# Patient Record
Sex: Female | Born: 1960 | Race: White | Hispanic: No | Marital: Married | Smoking: Never smoker
Health system: Southern US, Community
[De-identification: ages and names within clinical notes are randomized; demographics above are authoritative.]

## PROBLEM LIST (undated history)

## (undated) DIAGNOSIS — R569 Unspecified convulsions: Secondary | ICD-10-CM

## (undated) DIAGNOSIS — E785 Hyperlipidemia, unspecified: Secondary | ICD-10-CM

## (undated) DIAGNOSIS — I10 Essential (primary) hypertension: Secondary | ICD-10-CM

## (undated) HISTORY — DX: Hyperlipidemia, unspecified: E78.5

---

## 2010-09-23 ENCOUNTER — Emergency Department: Payer: Self-pay | Admitting: *Deleted

## 2011-06-16 ENCOUNTER — Emergency Department (HOSPITAL_COMMUNITY): Payer: 59

## 2011-06-16 ENCOUNTER — Emergency Department (HOSPITAL_COMMUNITY)
Admission: EM | Admit: 2011-06-16 | Discharge: 2011-06-16 | Disposition: A | Discharge status: Home / Self Care | Payer: 59 | Attending: Emergency Medicine | Admitting: Emergency Medicine

## 2011-06-16 ENCOUNTER — Encounter (HOSPITAL_COMMUNITY): Payer: Self-pay | Admitting: *Deleted

## 2011-06-16 DIAGNOSIS — S60229A Contusion of unspecified hand, initial encounter: Secondary | ICD-10-CM | POA: Insufficient documentation

## 2011-06-16 DIAGNOSIS — I10 Essential (primary) hypertension: Secondary | ICD-10-CM | POA: Insufficient documentation

## 2011-06-16 DIAGNOSIS — M79609 Pain in unspecified limb: Secondary | ICD-10-CM | POA: Insufficient documentation

## 2011-06-16 HISTORY — DX: Essential (primary) hypertension: I10

## 2011-06-16 HISTORY — DX: Unspecified convulsions: R56.9
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
## 2011-06-16 NOTE — Discharge Instructions (Signed)
Contusion A contusion is a deep bruise. Contusions are the result of an injury that caused bleeding under the skin. The contusion may turn blue, purple, or yellow. Minor injuries will give you a painless contusion, but more severe contusions may stay painful and swollen for a few weeks.  CAUSES  A contusion is usually caused by a blow, trauma, or direct force to an area of the body. SYMPTOMS   Swelling and redness of the injured area.   Bruising of the injured area.   Tenderness and soreness of the injured area.   Pain.  DIAGNOSIS  The diagnosis can be made by taking a history and physical exam. An X-ray, CT scan, or MRI may be needed to determine if there were any associated injuries, such as fractures. TREATMENT  Specific treatment will depend on what area of the body was injured. In general, the best treatment for a contusion is resting, icing, elevating, and applying cold compresses to the injured area. Over-the-counter medicines may also be recommended for pain control. Ask your caregiver what the best treatment is for your contusion. HOME CARE INSTRUCTIONS   Put ice on the injured area.   Put ice in a plastic bag.   Place a towel between your skin and the bag.   Leave the ice on for 15 to 20 minutes, 3 to 4 times a day.   Only take over-the-counter or prescription medicines for pain, discomfort, or fever as directed by your caregiver. Your caregiver may recommend avoiding anti-inflammatory medicines (aspirin, ibuprofen, and naproxen) for 48 hours because these medicines may increase bruising.   Rest the injured area.   If possible, elevate the injured area to reduce swelling.  SEEK IMMEDIATE MEDICAL CARE IF:   You have increased bruising or swelling.   You have pain that is getting worse.   Your swelling or pain is not relieved with medicines.  MAKE SURE YOU:   Understand these instructions.   Will watch your condition.   Will get help right away if you are not  doing well or get worse.  Document Released: 10/12/2004 Document Revised: 12/22/2010 Document Reviewed: 11/07/2010 [facility] Patient Information 2012 [place], [place].    As discussed,  Use your splint for comfort and swelling reduction.  You may stop using this once your pain is better.  Use ibuprofen 600 mg every 8 hours (with food).  Please call your doctor for a recheck if not improving over the next week.  Your xrays are negative today.  Continue using an ice pack 10 minutes every hour while awake for the next 2 days.
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
## 2011-06-16 NOTE — ED Notes (Signed)
Pt refused sling at this time, states she does not feel she would use it.
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
## 2011-06-17 NOTE — ED Provider Notes (Signed)
Medical screening examination/treatment/procedure(s) were performed by non-physician practitioner and as supervising physician I was immediately available for consultation/collaboration.   Dayton Bailiff, MD 06/17/11 [phone]

## 2012-06-25 ENCOUNTER — Telehealth: Payer: Self-pay | Admitting: Neurology

## 2012-07-27 ENCOUNTER — Other Ambulatory Visit: Payer: Self-pay | Admitting: Neurology

## 2012-10-01 ENCOUNTER — Telehealth: Payer: Self-pay | Admitting: Neurology

## 2012-10-01 ENCOUNTER — Encounter: Payer: Self-pay | Admitting: Neurology

## 2012-10-01 NOTE — Telephone Encounter (Signed)
Patient left message that she needed a letter faxed to [facility] stating her upcoming appointment on 11-14-12.  I have faxed letter to [facility] and received a confirmation.  Also left message for patient that letter was faxed.

## 2012-10-24 ENCOUNTER — Telehealth: Payer: Self-pay | Admitting: Family Medicine

## 2012-10-25 NOTE — Telephone Encounter (Signed)
Not seen here in over 6 months, Dr. Anne Hahn has been filling, notified pt

## 2012-11-13 ENCOUNTER — Telehealth: Payer: Self-pay | Admitting: *Deleted

## 2012-11-14 ENCOUNTER — Ambulatory Visit (INDEPENDENT_AMBULATORY_CARE_PROVIDER_SITE_OTHER): Payer: 59 | Admitting: Nurse Practitioner

## 2012-11-14 ENCOUNTER — Encounter (INDEPENDENT_AMBULATORY_CARE_PROVIDER_SITE_OTHER): Payer: Self-pay

## 2012-11-14 ENCOUNTER — Encounter: Payer: Self-pay | Admitting: Nurse Practitioner

## 2012-11-14 VITALS — BP 116/72 | HR 83 | Ht 69.0 in | Wt 301.0 lb

## 2012-11-14 DIAGNOSIS — G40309 Generalized idiopathic epilepsy and epileptic syndromes, not intractable, without status epilepticus: Secondary | ICD-10-CM | POA: Insufficient documentation

## 2012-11-14 DIAGNOSIS — Z79899 Other long term (current) drug therapy: Secondary | ICD-10-CM | POA: Insufficient documentation

## 2012-11-14 DIAGNOSIS — Z0289 Encounter for other administrative examinations: Secondary | ICD-10-CM

## 2012-11-14 LAB — PHENYTOIN LEVEL, TOTAL: Phenytoin Lvl: 22.2 ug/mL (ref 10.0–20.0)

## 2012-11-14 MED ORDER — DILANTIN 100 MG PO CAPS: ORAL_CAPSULE | ORAL | Status: DC

## 2012-11-14 NOTE — Patient Instructions (Signed)
Check Dilantin level Will renew Dilantin, given co-pay card information Followup yearly and when necessary

## 2012-11-14 NOTE — Progress Notes (Signed)
I have read the note, and I agree with the clinical assessment and plan.  Jahzir Strohmeier KEITH   

## 2012-11-14 NOTE — Progress Notes (Signed)
[facility]  PATIENT: Maria Rivers DOB: Jan 07, 1961   REASON FOR VISIT: Followup for seizure disorder   HISTORY OF PRESENT ILLNESS:Maria Rivers, 52 year old returns for follow up. He has history of obesity, and a history of seizures. She was last seen 05/03/11.  Last seizure activity was  a brief staring episode in July 2012, and the Dilantin level was found to be low in the 6.0 range. The patient was increased on the Dilantin, currently taking 700 mg of Dilantin daily BRAND. The patient has done well on this dose, and has not had any signs of toxicity such as drowsiness or gait instability. Dr. Christell Constant follows routine labs.   Pt has DMV forms to fill out.    REVIEW OF SYSTEMS: Full 14 system review of systems performed and notable only for:  Constitutional: N/A  Cardiovascular: N/A  Ear/Nose/Throat: N/A  Skin: N/A  Eyes: N/A  Respiratory: N/A  Gastroitestinal: N/A  Hematology/Lymphatic: N/A  Endocrine: N/A Musculoskeletal: Osteoarthritis Allergy/Immunology: Seasonal allergies  Neurological: N/A Psychiatric: N/A   ALLERGIES: Allergies  Allergen Reactions  . Mushroom Extract Complex Swelling  . Penicillins     UNKNOWN REACTION  . Shellfish Allergy Swelling    HOME MEDICATIONS: Outpatient Prescriptions Prior to Visit  Medication Sig Dispense Refill  . cholecalciferol (VITAMIN D) 1000 UNITS tablet Take 2,000 Units by mouth 2 (two) times daily.       Marland Kitchen DILANTIN 100 MG ER capsule TAKE 3 CAPSULES BY MOUTH IN THE MORNING, AND 4 CAPSULES IN THE EVENING.  210 capsule  0  . hydrochlorothiazide (HYDRODIURIL) 25 MG tablet Take 25 mg by mouth daily.      Marland Kitchen ibuprofen (ADVIL,MOTRIN) 200 MG tablet Take 200 mg by mouth as needed. For pain      . lisinopril (PRINIVIL,ZESTRIL) 10 MG tablet Take 10 mg by mouth daily.      Marland Kitchen loratadine (CLARITIN) 10 MG tablet Take 10 mg by mouth daily.       No facility-administered medications prior to visit.    PAST MEDICAL HISTORY: Past  Medical History  Diagnosis Date  . Hypertension   . Seizures     PAST SURGICAL HISTORY: History reviewed. No pertinent past surgical history.  FAMILY HISTORY: History reviewed. No pertinent family history.  SOCIAL HISTORY: History   Social History  . Marital Status: Married    Spouse Name: N/A    Number of Children: N/A  . Years of Education: N/A   Occupational History  . Not on file.   Social History Main Topics  . Smoking status: Never Smoker   . Smokeless tobacco: Never Used  . Alcohol Use: No  . Drug Use: No  . Sexual Activity: Yes    Birth Control/ Protection: None   Other Topics Concern  . Not on file   Social History Narrative  . No narrative on file     PHYSICAL EXAM  Filed Vitals:   11/14/12 1357  BP: 116/72  Pulse: 83  Height: 5\' 9"  (1.753 m)  Weight: 301 lb (136.533 kg)   Body mass index is 44.43 kg/(m^2).  Generalized: Well developed, morbidly obese female in no acute distress   Neurological examination   Mentation: Alert oriented to time, place, history taking. Follows all commands speech and language fluent  Cranial nerve II-XII: Pupils were equal round reactive to light extraocular movements were full, visual field were full on confrontational test. Facial sensation and strength were normal. hearing was intact to finger rubbing bilaterally.  Uvula tongue midline. head turning and shoulder shrug and were normal and symmetric.Tongue protrusion into cheek strength was normal. Motor: normal bulk and tone, full strength in the BUE, BLE, fine finger movements normal, no pronator drift. No focal weakness Coordination: finger-nose-finger,  no dysmetria Reflexes: Depressed and symmetric upper and lower Gait and Station: Rising up from seated position without assistance, normal stance,  moderate stride, good arm swing, smooth turning, able to perform tiptoe, and heel walking without difficulty. Tandem gait steady   DIAGNOSTIC DATA (LABS, IMAGING,  TESTING) None to review   ASSESSMENT AND PLAN  52 y.o. year old female  has a past medical history of Seizures. here in followup. Currently on brand-name Dilantin 700 mg daily in divided doses  Check Dilantin level, PCP Dr. Christell Constant follows routine labs Will renew Dilantin, given co-pay card information Followup yearly and when necessary  [name], [facility], [facility], APRN  [facility] [address], [address] [place], [place] [address] [phone]

## 2012-11-21 ENCOUNTER — Other Ambulatory Visit: Payer: Self-pay | Admitting: Family Medicine

## 2012-11-28 ENCOUNTER — Ambulatory Visit (INDEPENDENT_AMBULATORY_CARE_PROVIDER_SITE_OTHER): Payer: 59 | Admitting: Family Medicine

## 2012-11-28 ENCOUNTER — Encounter: Payer: Self-pay | Admitting: Family Medicine

## 2012-11-28 VITALS — BP 119/82 | HR 86 | Temp 97.3°F | Ht 69.0 in | Wt 296.0 lb

## 2012-11-28 DIAGNOSIS — G40309 Generalized idiopathic epilepsy and epileptic syndromes, not intractable, without status epilepticus: Secondary | ICD-10-CM

## 2012-11-28 DIAGNOSIS — R569 Unspecified convulsions: Secondary | ICD-10-CM

## 2012-11-28 DIAGNOSIS — E785 Hyperlipidemia, unspecified: Secondary | ICD-10-CM

## 2012-11-28 DIAGNOSIS — E782 Mixed hyperlipidemia: Secondary | ICD-10-CM | POA: Insufficient documentation

## 2012-11-28 DIAGNOSIS — I10 Essential (primary) hypertension: Secondary | ICD-10-CM

## 2012-11-28 DIAGNOSIS — E039 Hypothyroidism, unspecified: Secondary | ICD-10-CM

## 2012-11-28 LAB — POCT CBC
HCT, POC: 40.6 % (ref 37.7–47.9)
Hemoglobin: 13.4 g/dL (ref 12.2–16.2)
MCH, POC: 29.9 pg (ref 27–31.2)
MCHC: 32.9 g/dL (ref 31.8–35.4)
MCV: 90.8 fL (ref 80–97)
POC Granulocyte: 5.9 (ref 2–6.9)
WBC: 8.2 10*3/uL (ref 4.6–10.2)

## 2012-11-28 NOTE — Progress Notes (Signed)
Subjective:    Patient ID: Maria Rivers, female    DOB: 1960/06/08, 52 y.o.   MRN: [id number]  HPI Pt here for follow up and management of chronic medical problems. Problems basically include hyperlipidemia, hypertension, high risk medication use, and hypothyroidism. On reviewing the health maintenance parameters she refuses to get a mammogram, Pap smear/pelvic, DEXA scan, chest x-ray, FOBT, and colonoscopy. She has had her flu shot. She will check her insurance regarding the Prevnar. We will: And draw labs work today even though she is not fasting. One year ago her weight was 295. Today it is 296.      Patient Active Problem List   Diagnosis Date Noted  . Generalized convulsive epilepsy without mention of intractable epilepsy 11/14/2012  . Encounter for long-term (current) use of other medications 11/14/2012   Outpatient Encounter Prescriptions as of 11/28/2012  Medication Sig  . cholecalciferol (VITAMIN D) 1000 UNITS tablet Take 2,000 Units by mouth 2 (two) times daily.   Marland Kitchen DILANTIN 100 MG ER capsule 3 caps in the am and 4 in the pm  . hydrochlorothiazide (HYDRODIURIL) 25 MG tablet TAKE 1 TABLET BY MOUTH ONCE DAILY  . ibuprofen (ADVIL,MOTRIN) 200 MG tablet Take 200 mg by mouth as needed. For pain  . levothyroxine (SYNTHROID, LEVOTHROID) 25 MCG tablet TAKE 1 TABLET BY MOUTH EVERY DAY  . lisinopril (PRINIVIL,ZESTRIL) 10 MG tablet TAKE 1 TABLET BY MOUTH ONCE DAILY  . loratadine (CLARITIN) 10 MG tablet Take 10 mg by mouth daily.  . pravastatin (PRAVACHOL) 40 MG tablet TAKE 1 TABLET BY MOUTH ONCE DAILY    Review of Systems  Constitutional: Negative.   HENT: Negative.   Eyes: Negative.   Respiratory: Negative.   Cardiovascular: Negative.   Gastrointestinal: Negative.   Endocrine: Negative.   Genitourinary: Negative.   Musculoskeletal: Negative.   Skin: Negative.   Allergic/Immunologic: Negative.   Neurological: Negative.   Hematological: Negative.   Psychiatric/Behavioral:  Negative.        Objective:   Physical Exam  Nursing note and vitals reviewed. Constitutional: She is oriented to person, place, and time. She appears well-developed and well-nourished.  Overweight  HENT:  Head: Normocephalic and atraumatic.  Right Ear: External ear normal.  Left Ear: External ear normal.  Mouth/Throat: Oropharynx is clear and moist. No oropharyngeal exudate.  Nasal congestion bilaterally  Eyes: Conjunctivae and EOM are normal. Pupils are equal, round, and reactive to light. Right eye exhibits no discharge. Left eye exhibits no discharge. No scleral icterus.  Neck: Normal range of motion. Neck supple. No thyromegaly present.  Cardiovascular: Normal rate, regular rhythm, normal heart sounds and intact distal pulses.  Exam reveals no gallop and no friction rub.   No murmur heard. At 72 per minute  Pulmonary/Chest: Effort normal and breath sounds normal. No respiratory distress. She has no wheezes. She has no rales.  Abdominal: Soft. Bowel sounds are normal. She exhibits no mass. There is no tenderness. There is no rebound and no guarding.  Obesity  Musculoskeletal: Normal range of motion. She exhibits no edema and no tenderness.  Lymphadenopathy:    She has no cervical adenopathy.  Neurological: She is alert and oriented to person, place, and time. She has normal reflexes.  Skin: Skin is warm and dry. No rash noted.  Psychiatric: She has a normal mood and affect. Her behavior is normal. Judgment and thought content normal.   BP 119/82  Pulse 86  Temp(Src) 97.3 F (36.3 C) (Oral)  Ht 5\' 9"  (1.753 m)  Wt 296 lb (134.265 kg)  BMI 43.69 kg/m2  LMP 03/16/2011        Assessment & Plan:   1. HTN (hypertension)   2. Seizures   3. Hyperlipidemia   4. Hypothyroidism   5. Generalized convulsive epilepsy without mention of intractable epilepsy   6. Morbid obesity    Orders Placed This Encounter  Procedures  . Hepatic function panel  . Lipid panel  .  BMP8+EGFR  . Vit D  25 hydroxy (rtn osteoporosis monitoring)  . Thyroid Panel With TSH  . POCT CBC   No orders of the defined types were placed in this encounter.   Patient Instructions  Continue current medications. Continue aggressive therapeutic lifestyle changes which include good diet and exercise. Fall precautions discussed with patient. If you are over 75 years old - you may need Prevnar 13 or the adult Pneumonia vaccine. Also check and see if the hospital will give you a zostavax   Nyra Capes MD

## 2012-11-28 NOTE — Patient Instructions (Addendum)
Continue current medications. Continue aggressive therapeutic lifestyle changes which include good diet and exercise. Fall precautions discussed with patient. If you are over 54 years old - you may need Prevnar 13 or the adult Pneumonia vaccine. Also check and see if the hospital will give you a zostavax

## 2012-11-29 LAB — THYROID PANEL WITH TSH
T4, Total: 6.2 ug/dL (ref 4.5–12.0)
TSH: 2.67 u[IU]/mL (ref 0.450–4.500)

## 2012-11-29 LAB — LIPID PANEL
Cholesterol, Total: 220 mg/dL — ABNORMAL HIGH (ref 100–199)
LDL Calculated: 146 mg/dL — ABNORMAL HIGH (ref 0–99)

## 2012-11-29 LAB — BMP8+EGFR
BUN/Creatinine Ratio: 24 — ABNORMAL HIGH (ref 9–23)
BUN: 24 mg/dL (ref 6–24)
CO2: 27 mmol/L (ref 18–29)
Calcium: 9.3 mg/dL (ref 8.7–10.2)
Creatinine, Ser: 0.98 mg/dL (ref 0.57–1.00)
GFR calc non Af Amer: 66 mL/min/{1.73_m2} (ref >59)

## 2012-11-29 LAB — HEPATIC FUNCTION PANEL
AST: 21 IU/L (ref 0–40)
Alkaline Phosphatase: 112 IU/L (ref 39–117)
Bilirubin, Direct: 0.12 mg/dL (ref 0.00–0.40)
Total Protein: 7.4 g/dL (ref 6.0–8.5)

## 2012-12-02 ENCOUNTER — Other Ambulatory Visit: Payer: Self-pay | Admitting: *Deleted

## 2012-12-02 DIAGNOSIS — E785 Hyperlipidemia, unspecified: Secondary | ICD-10-CM

## 2012-12-02 MED ORDER — PRAVASTATIN SODIUM 40 MG PO TABS: 80.0000 mg | ORAL_TABLET | Freq: Every day | ORAL | Status: DC

## 2012-12-02 NOTE — Telephone Encounter (Signed)
Patient was seen. 

## 2012-12-05 ENCOUNTER — Telehealth: Payer: Self-pay | Admitting: Neurology

## 2012-12-05 NOTE — Telephone Encounter (Signed)
I called patient. Forms have been faxed to [facility]. Were sent yesterday. Will mail forms along with confirmation to patient.

## 2012-12-06 ENCOUNTER — Telehealth: Payer: Self-pay | Admitting: Neurology

## 2013-03-12 IMAGING — CR DG FOREARM 2V*R*
1 series · 1 of 1 positions shown · non-contrast
Comparison: None.

CLINICAL DATA: Forearm and hand pain.  Bruise on the medial
proximal forearm area.

RIGHT FOREARM - 2 VIEW

[view not recorded]
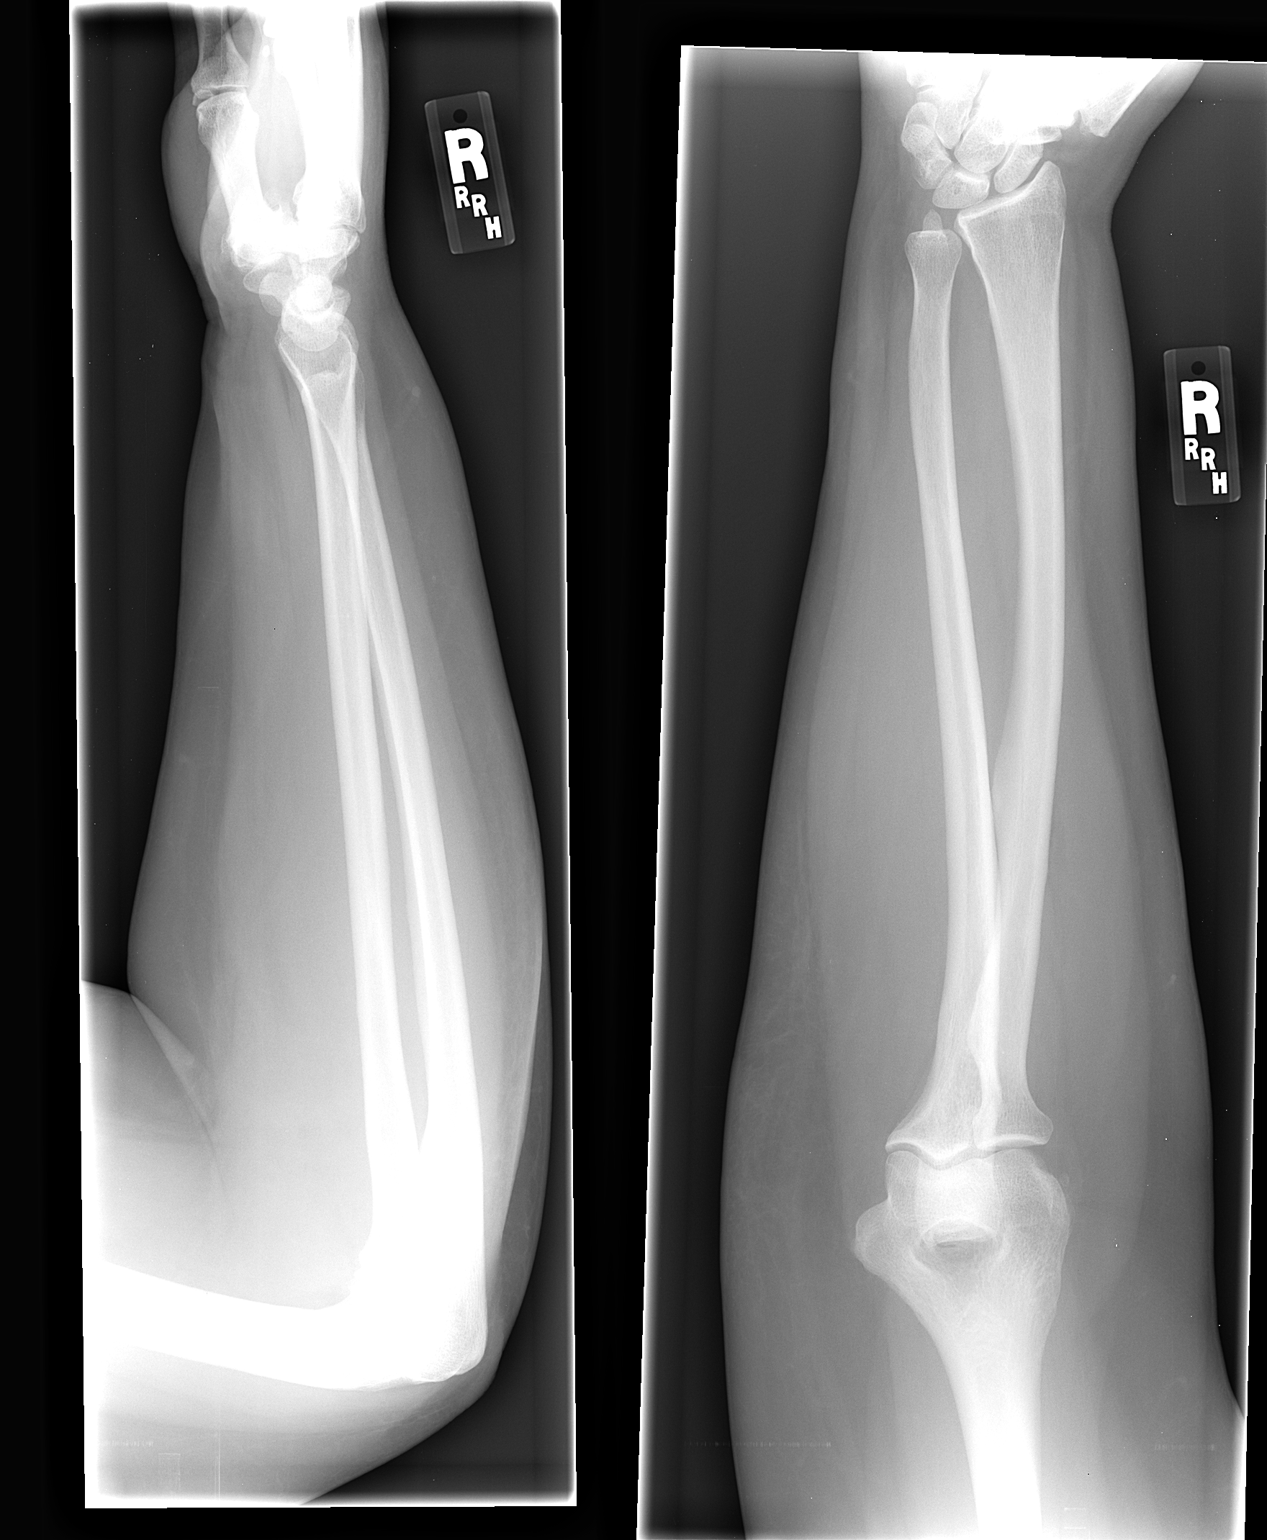

[1 of 1 positions shown; findings below may reference images not displayed]

FINDINGS: Radius and ulna appear normal.  Partially visualized
basal joint of the thumb osteoarthritis.  Partial visualization of
the elbow joint is within normal limits.  Calcific tendonitis of
the common extensor origin.  No fracture or radiopaque foreign
body.
IMPRESSION: No acute osseous abnormality.

## 2013-06-05 ENCOUNTER — Encounter: Payer: Self-pay | Admitting: Family Medicine

## 2013-06-05 ENCOUNTER — Ambulatory Visit (INDEPENDENT_AMBULATORY_CARE_PROVIDER_SITE_OTHER): Payer: 59 | Admitting: Family Medicine

## 2013-06-05 VITALS — BP 115/75 | HR 82 | Temp 97.1°F | Ht 69.0 in | Wt 292.0 lb

## 2013-06-05 DIAGNOSIS — E8881 Metabolic syndrome: Secondary | ICD-10-CM | POA: Insufficient documentation

## 2013-06-05 DIAGNOSIS — E785 Hyperlipidemia, unspecified: Secondary | ICD-10-CM

## 2013-06-05 DIAGNOSIS — E039 Hypothyroidism, unspecified: Secondary | ICD-10-CM

## 2013-06-05 DIAGNOSIS — G40909 Epilepsy, unspecified, not intractable, without status epilepticus: Secondary | ICD-10-CM

## 2013-06-05 DIAGNOSIS — I10 Essential (primary) hypertension: Secondary | ICD-10-CM

## 2013-06-05 DIAGNOSIS — E559 Vitamin D deficiency, unspecified: Secondary | ICD-10-CM

## 2013-06-05 LAB — POCT CBC
Granulocyte percent: 71.7 %G (ref 37–80)
HCT, POC: 41.2 % (ref 37.7–47.9)
HEMOGLOBIN: 13.6 g/dL (ref 12.2–16.2)
LYMPH, POC: 2.1 (ref 0.6–3.4)
MCH: 29.2 pg (ref 27–31.2)
MCHC: 32.9 g/dL (ref 31.8–35.4)
MCV: 88.8 fL (ref 80–97)
MPV: 8 fL (ref 0–99.8)
PLATELET COUNT, POC: 255 10*3/uL (ref 142–424)
POC GRANULOCYTE: 6 (ref 2–6.9)
POC LYMPH %: 25.2 % (ref 10–50)
RBC: 4.7 M/uL (ref 4.04–5.48)
RDW, POC: 13.5 %
WBC: 8.4 10*3/uL (ref 4.6–10.2)

## 2013-06-05 LAB — POCT GLYCOSYLATED HEMOGLOBIN (HGB A1C)

## 2013-06-05 NOTE — Progress Notes (Signed)
Subjective:    Patient ID: Maria Rivers, female    DOB: Jan 29, 1960, 53 y.o.   MRN: [id number]  HPI Pt here for follow up and management of chronic medical problems. The patient is followed regularly by the neurologist for her epilepsy. She'll check with her insurance regarding the Prevnar vaccine. She refuses pelvic exam, mammogram, DEXA scan, chest x-ray, and FOBT. She will get lab work done today. The Dilantin level is done by the neurologist. He is okay with this being done once yearly.          Patient Active Problem List   Diagnosis Date Noted  . Hyperlipidemia 11/28/2012  . Hypothyroidism 11/28/2012  . Hypertension 11/28/2012  . Generalized convulsive epilepsy without mention of intractable epilepsy 11/14/2012  . Encounter for long-term (current) use of other medications 11/14/2012   Outpatient Encounter Prescriptions as of 06/05/2013  Medication Sig  . cholecalciferol (VITAMIN D) 1000 UNITS tablet Take 2,000 Units by mouth daily.   Marland Kitchen DILANTIN 100 MG ER capsule 3 caps in the am and 4 in the pm  . hydrochlorothiazide (HYDRODIURIL) 25 MG tablet TAKE 1 TABLET BY MOUTH ONCE DAILY  . ibuprofen (ADVIL,MOTRIN) 200 MG tablet Take 200 mg by mouth as needed. For pain  . levothyroxine (SYNTHROID, LEVOTHROID) 25 MCG tablet TAKE 1 TABLET BY MOUTH EVERY DAY  . lisinopril (PRINIVIL,ZESTRIL) 10 MG tablet TAKE 1 TABLET BY MOUTH ONCE DAILY  . loratadine (CLARITIN) 10 MG tablet Take 10 mg by mouth daily.  . pravastatin (PRAVACHOL) 40 MG tablet Take 40 mg by mouth daily.  . [DISCONTINUED] pravastatin (PRAVACHOL) 40 MG tablet Take 2 tablets (80 mg total) by mouth daily.    Review of Systems  Constitutional: Negative.   HENT: Negative.   Eyes: Negative.   Respiratory: Negative.   Cardiovascular: Negative.   Gastrointestinal: Negative.   Endocrine: Negative.   Genitourinary: Negative.   Musculoskeletal: Negative.   Skin: Negative.   Allergic/Immunologic: Negative.   Neurological:  Negative.   Hematological: Negative.   Psychiatric/Behavioral: Negative.        Objective:   Physical Exam  Nursing note and vitals reviewed. Constitutional: She is oriented to person, place, and time. She appears well-developed and well-nourished. No distress.  Patient comes to the visit today with her husband.  HENT:  Head: Normocephalic and atraumatic.  Right Ear: External ear normal.  Left Ear: External ear normal.  Nose: Nose normal.  Mouth/Throat: Oropharynx is clear and moist.  Hygiene and gingivitis much improved  Eyes: Conjunctivae and EOM are normal. Pupils are equal, round, and reactive to light. Right eye exhibits no discharge. Left eye exhibits no discharge. No scleral icterus.  Neck: Normal range of motion. Neck supple. No thyromegaly present.  Cardiovascular: Normal rate, regular rhythm, normal heart sounds and intact distal pulses.   No murmur heard.  At 72 per minute  Pulmonary/Chest: Effort normal and breath sounds normal. No respiratory distress. She has no wheezes. She has no rales. She exhibits no tenderness.  Abdominal: Soft. Bowel sounds are normal. She exhibits no mass. There is no tenderness. There is no rebound and no guarding.  Obesity without tenderness or masses  Musculoskeletal: Normal range of motion. She exhibits no edema and no tenderness.  Lymphadenopathy:    She has no cervical adenopathy.  Neurological: She is alert and oriented to person, place, and time. She has normal reflexes. No cranial nerve deficit.  Skin: Skin is warm and dry. No rash noted.  Psychiatric: She has a normal  mood and affect. Her behavior is normal. Judgment and thought content normal.   BP 115/75  Pulse 82  Temp(Src) 97.1 F (36.2 C) (Oral)  Ht 5' 9" (1.753 m)  Wt 292 lb (132.45 kg)  BMI 43.10 kg/m2  LMP 03/16/2011        Assessment & Plan:  1. Hyperlipidemia - POCT CBC - Lipid panel  2. Hypertension - POCT CBC - BMP8+EGFR - Hepatic function panel  3.  Hypothyroidism - POCT CBC - Thyroid Panel With TSH  4. Vitamin D deficiency - Vit D  25 hydroxy (rtn osteoporosis monitoring)  5. Metabolic syndrome - POCT glycosylated hemoglobin (Hb A1C)  6. Seizure disorder -Followed by the neurologist  Patient Instructions  Continue current medications. Continue good therapeutic lifestyle changes which include good diet and exercise. Fall precautions discussed with patient. If an FOBT was given today- please return it to our front desk. If you are over 10 years old - you may need Prevnar 13 or the adult Pneumonia vaccine.  Drink more water Exercise as much as possible Continue aggressive therapeutic lifestyle changes Check with her insurance regarding the Prevnar vaccine We will call you with your lab work results once those results are available   Arrie Senate MD

## 2013-06-05 NOTE — Patient Instructions (Addendum)
Continue current medications. Continue good therapeutic lifestyle changes which include good diet and exercise. Fall precautions discussed with patient. If an FOBT was given today- please return it to our front desk. If you are over 55 years old - you may need Prevnar 13 or the adult Pneumonia vaccine.  Drink more water Exercise as much as possible Continue aggressive therapeutic lifestyle changes Check with her insurance regarding the Prevnar vaccine We will call you with your lab work results once those results are available

## 2013-06-06 LAB — BMP8+EGFR
BUN/Creatinine Ratio: 29 — ABNORMAL HIGH (ref 9–23)
BUN: 27 mg/dL — ABNORMAL HIGH (ref 6–24)
CALCIUM: 9.2 mg/dL (ref 8.7–10.2)
CHLORIDE: 92 mmol/L — AB (ref 97–108)
CO2: 25 mmol/L (ref 18–29)
Creatinine, Ser: 0.93 mg/dL (ref 0.57–1.00)
GFR calc Af Amer: 81 mL/min/{1.73_m2} (ref >59)
GFR calc non Af Amer: 70 mL/min/{1.73_m2} (ref >59)
GLUCOSE: 68 mg/dL (ref 65–99)
POTASSIUM: 4.6 mmol/L (ref 3.5–5.2)
SODIUM: 135 mmol/L (ref 134–144)

## 2013-06-06 LAB — LIPID PANEL
CHOLESTEROL TOTAL: 210 mg/dL — AB (ref 100–199)
Chol/HDL Ratio: 4.5 ratio units — ABNORMAL HIGH (ref 0.0–4.4)
HDL: 47 mg/dL (ref >39)
LDL CALC: 126 mg/dL — AB (ref 0–99)
TRIGLYCERIDES: 184 mg/dL — AB (ref 0–149)
VLDL CHOLESTEROL CAL: 37 mg/dL (ref 5–40)

## 2013-06-06 LAB — HEPATIC FUNCTION PANEL
ALT: 20 IU/L (ref 0–32)
AST: 19 IU/L (ref 0–40)
Albumin: 4.2 g/dL (ref 3.5–5.5)
Alkaline Phosphatase: 118 IU/L — ABNORMAL HIGH (ref 39–117)
BILIRUBIN DIRECT: 0.09 mg/dL (ref 0.00–0.40)
TOTAL PROTEIN: 7.2 g/dL (ref 6.0–8.5)
Total Bilirubin: <0.2 mg/dL (ref 0.0–1.2)

## 2013-06-06 LAB — THYROID PANEL WITH TSH
FREE THYROXINE INDEX: 1.2 (ref 1.2–4.9)
T3 Uptake Ratio: 24 % (ref 24–39)
T4, Total: 5 ug/dL (ref 4.5–12.0)
TSH: 3.23 u[IU]/mL (ref 0.450–4.500)

## 2013-06-06 LAB — VITAMIN D 25 HYDROXY (VIT D DEFICIENCY, FRACTURES): Vit D, 25-Hydroxy: 34.8 ng/mL (ref 30.0–100.0)

## 2013-09-26 ENCOUNTER — Encounter: Payer: Self-pay | Admitting: *Deleted

## 2013-10-06 ENCOUNTER — Ambulatory Visit (INDEPENDENT_AMBULATORY_CARE_PROVIDER_SITE_OTHER): Payer: 59 | Admitting: Family Medicine

## 2013-10-06 ENCOUNTER — Encounter: Payer: Self-pay | Admitting: Family Medicine

## 2013-10-06 ENCOUNTER — Ambulatory Visit: Payer: 59 | Admitting: Family Medicine

## 2013-10-06 VITALS — BP 119/77 | HR 82 | Temp 96.9°F | Ht 69.0 in

## 2013-10-06 DIAGNOSIS — I10 Essential (primary) hypertension: Secondary | ICD-10-CM

## 2013-10-06 DIAGNOSIS — E785 Hyperlipidemia, unspecified: Secondary | ICD-10-CM

## 2013-10-06 DIAGNOSIS — E559 Vitamin D deficiency, unspecified: Secondary | ICD-10-CM

## 2013-10-06 DIAGNOSIS — E8881 Metabolic syndrome: Secondary | ICD-10-CM

## 2013-10-06 DIAGNOSIS — E039 Hypothyroidism, unspecified: Secondary | ICD-10-CM

## 2013-10-06 DIAGNOSIS — G40309 Generalized idiopathic epilepsy and epileptic syndromes, not intractable, without status epilepticus: Secondary | ICD-10-CM

## 2013-10-06 LAB — POCT GLYCOSYLATED HEMOGLOBIN (HGB A1C): HEMOGLOBIN A1C: 5.2

## 2013-10-06 LAB — POCT CBC
GRANULOCYTE PERCENT: 70.3 % (ref 37–80)
HCT, POC: 39.8 % (ref 37.7–47.9)
HEMOGLOBIN: 13 g/dL (ref 12.2–16.2)
LYMPH, POC: 1.8 (ref 0.6–3.4)
MCH, POC: 29.5 pg (ref 27–31.2)
MCHC: 32.6 g/dL (ref 31.8–35.4)
MCV: 90.3 fL (ref 80–97)
MPV: 7.8 fL (ref 0–99.8)
PLATELET COUNT, POC: 239 10*3/uL (ref 142–424)
POC Granulocyte: 5.1 (ref 2–6.9)
POC LYMPH %: 25.3 % (ref 10–50)
RBC: 4.4 M/uL (ref 4.04–5.48)
RDW, POC: 14 %
WBC: 7.2 10*3/uL (ref 4.6–10.2)

## 2013-10-06 NOTE — Patient Instructions (Addendum)
Continue current medications. Continue good therapeutic lifestyle changes which include good diet and exercise. Fall precautions discussed with patient. If an FOBT was given today- please return it to our front desk. If you are over 55 years old - you may need Prevnar 13 or the adult Pneumonia vaccine.  Flu Shots will be available at our office starting mid- September. Please call and schedule a FLU CLINIC APPOINTMENT.   Please consider getting some other health maintenance issues taking care of We will call you of the lab work results once those results are available

## 2013-10-06 NOTE — Progress Notes (Signed)
Subjective:    Patient ID: Maria Rivers, female    DOB: 1960-12-03, 53 y.o.   MRN: [id number]  HPI Pt here for follow up and management of chronic medical problems. The patient comes to the visit today with her husband. She refuses all preventative treatments. These include colonoscopy Pap and pelvic DEXA scan chest x-ray and FOBT. She will get her traditional lab work today. She refused to get a weight today. She has no complaints. The patient denies chest pain shortness of breath were any seizure activity. She appears to be happy and in good spirits and enjoys her job.        Patient Active Problem List   Diagnosis Date Noted  . Metabolic syndrome 38/46/6599  . Hyperlipidemia 11/28/2012  . Hypothyroidism 11/28/2012  . Hypertension 11/28/2012  . Generalized convulsive epilepsy without mention of intractable epilepsy 11/14/2012  . Encounter for long-term (current) use of other medications 11/14/2012   Outpatient Encounter Prescriptions as of 10/06/2013  Medication Sig  . cholecalciferol (VITAMIN D) 1000 UNITS tablet Take 2,000 Units by mouth daily.   Marland Kitchen DILANTIN 100 MG ER capsule 3 caps in the am and 4 in the pm  . hydrochlorothiazide (HYDRODIURIL) 25 MG tablet TAKE 1 TABLET BY MOUTH ONCE DAILY  . ibuprofen (ADVIL,MOTRIN) 200 MG tablet Take 200 mg by mouth as needed. For pain  . levothyroxine (SYNTHROID, LEVOTHROID) 25 MCG tablet TAKE 1 TABLET BY MOUTH EVERY DAY  . lisinopril (PRINIVIL,ZESTRIL) 10 MG tablet TAKE 1 TABLET BY MOUTH ONCE DAILY  . loratadine (CLARITIN) 10 MG tablet Take 10 mg by mouth daily.  . pravastatin (PRAVACHOL) 40 MG tablet Take 40 mg by mouth daily.    Review of Systems  Constitutional: Negative.   HENT: Negative.   Eyes: Negative.   Respiratory: Negative.   Cardiovascular: Negative.   Gastrointestinal: Negative.   Endocrine: Negative.   Genitourinary: Negative.   Musculoskeletal: Negative.   Skin: Negative.   Allergic/Immunologic: Negative.     Neurological: Negative.   Hematological: Negative.   Psychiatric/Behavioral: Negative.        Objective:   Physical Exam  Nursing note and vitals reviewed. Constitutional: She is oriented to person, place, and time. She appears well-developed and well-nourished.  HENT:  Head: Normocephalic and atraumatic.  Right Ear: External ear normal.  Left Ear: External ear normal.  Nose: Nose normal.  Mouth/Throat: Oropharynx is clear and moist.  The patient's teeth and mouth are in good repair   Eyes: Conjunctivae and EOM are normal. Pupils are equal, round, and reactive to light. Right eye exhibits no discharge. Left eye exhibits no discharge. No scleral icterus.  Neck: Normal range of motion. Neck supple. No thyromegaly present.  No carotid bruits  Cardiovascular: Normal rate, regular rhythm, normal heart sounds and intact distal pulses.  Exam reveals no gallop and no friction rub.   No murmur heard. At 72 per minute  Pulmonary/Chest: Effort normal and breath sounds normal. No respiratory distress. She has no wheezes. She has no rales. She exhibits no tenderness.  Abdominal: Soft. Bowel sounds are normal. She exhibits no mass. There is no tenderness. There is no rebound and no guarding.  Morbid obesity without masses or tenderness  Musculoskeletal: Normal range of motion. She exhibits no edema and no tenderness.  Lymphadenopathy:    She has no cervical adenopathy.  Neurological: She is alert and oriented to person, place, and time. She has normal reflexes. No cranial nerve deficit.  Skin: Skin is warm and dry.  No rash noted.  Psychiatric: She has a normal mood and affect. Her behavior is normal. Judgment and thought content normal.   BP 119/77  Pulse 82  Temp(Src) 96.9 F (36.1 C) (Oral)  Ht 5' 9" (1.753 m)  LMP 03/16/2011        Assessment & Plan:  1. Hyperlipidemia - Lipid panel - POCT CBC  2. Essential hypertension - BMP8+EGFR - Hepatic function panel - POCT CBC  3.  Hypothyroidism, unspecified hypothyroidism type - POCT CBC  4. Metabolic syndrome - POCT CBC - POCT glycosylated hemoglobin (Hb A1C)  5. Generalized convulsive epilepsy without mention of intractable epilepsy - Phenytoin level, total and free - POCT CBC  6. Vitamin D deficiency - Vit D  25 hydroxy (rtn osteoporosis monitoring)  7. Morbid obesity  No orders of the defined types were placed in this encounter.   Patient Instructions  Continue current medications. Continue good therapeutic lifestyle changes which include good diet and exercise. Fall precautions discussed with patient. If an FOBT was given today- please return it to our front desk. If you are over 79 years old - you may need Prevnar 56 or the adult Pneumonia vaccine.  Flu Shots will be available at our office starting mid- September. Please call and schedule a FLU CLINIC APPOINTMENT.   Please consider getting some other health maintenance issues taking care of We will call you of the lab work results once those results are available   Arrie Senate MD

## 2013-10-07 ENCOUNTER — Ambulatory Visit: Payer: 59 | Admitting: Family Medicine

## 2013-10-08 ENCOUNTER — Telehealth: Payer: Self-pay | Admitting: Neurology

## 2013-10-08 LAB — BMP8+EGFR
BUN / CREAT RATIO: 31 — AB (ref 9–23)
BUN: 28 mg/dL — AB (ref 6–24)
CHLORIDE: 96 mmol/L — AB (ref 97–108)
CO2: 24 mmol/L (ref 18–29)
Calcium: 9.8 mg/dL (ref 8.7–10.2)
Creatinine, Ser: 0.89 mg/dL (ref 0.57–1.00)
GFR calc non Af Amer: 74 mL/min/{1.73_m2} (ref >59)
GFR, EST AFRICAN AMERICAN: 85 mL/min/{1.73_m2} (ref >59)
GLUCOSE: 79 mg/dL (ref 65–99)
POTASSIUM: 4.6 mmol/L (ref 3.5–5.2)
Sodium: 137 mmol/L (ref 134–144)

## 2013-10-08 LAB — HEPATIC FUNCTION PANEL
ALBUMIN: 4.3 g/dL (ref 3.5–5.5)
ALK PHOS: 112 IU/L (ref 39–117)
ALT: 21 IU/L (ref 0–32)
AST: 19 IU/L (ref 0–40)
Bilirubin, Direct: 0.09 mg/dL (ref 0.00–0.40)
TOTAL PROTEIN: 7.3 g/dL (ref 6.0–8.5)
Total Bilirubin: 0.2 mg/dL (ref 0.0–1.2)

## 2013-10-08 LAB — PHENYTOIN LEVEL, FREE
PHENYTOIN: 25.6 ug/mL — AB (ref 10.0–20.0)
Phenytoin, Free: 1.7 ug/mL (ref 1.0–2.0)

## 2013-10-08 LAB — LIPID PANEL
CHOLESTEROL TOTAL: 225 mg/dL — AB (ref 100–199)
Chol/HDL Ratio: 4.8 ratio units — ABNORMAL HIGH (ref 0.0–4.4)
HDL: 47 mg/dL (ref >39)
LDL Calculated: 140 mg/dL — ABNORMAL HIGH (ref 0–99)
TRIGLYCERIDES: 191 mg/dL — AB (ref 0–149)
VLDL Cholesterol Cal: 38 mg/dL (ref 5–40)

## 2013-10-08 LAB — VITAMIN D 25 HYDROXY (VIT D DEFICIENCY, FRACTURES): Vit D, 25-Hydroxy: 51.7 ng/mL (ref 30.0–100.0)

## 2013-10-08 NOTE — Telephone Encounter (Signed)
I called patient. I got a report of the from Dr. Christell Constant, the Dilantin level was 25.6. I'll have the patient cut back to 300 mg twice daily on the Dilantin. We will see her in November.

## 2013-10-09 ENCOUNTER — Telehealth: Payer: Self-pay | Admitting: Family Medicine

## 2013-10-09 ENCOUNTER — Telehealth: Payer: Self-pay | Admitting: Neurology

## 2013-10-09 NOTE — Telephone Encounter (Signed)
Events noted, the Dilantin level was around 25, will need to be rechecked and she is seen in November.

## 2013-10-09 NOTE — Telephone Encounter (Signed)
Patient received message and she will decrease Dilantin and follow up with CM in November.

## 2013-10-09 NOTE — Telephone Encounter (Signed)
Patient calling to state that she received call from Dr Anne Hahn and starting yesterday she reduced Dilantin to 600 mg as instructed, if questions, please call.

## 2013-10-09 NOTE — Telephone Encounter (Signed)
Message copied by Azalee Course on Thu Oct 09, 2013  9:38 AM ------      Message from: Ernestina Penna      Created: Wed Oct 08, 2013  7:14 AM       The Dilantin level is elevated.----- please confirm her current dosage regimen and the time when she took the Dilantin prior to the blood draw. This is important.+++++++ we may need to to adjust the Dilantin dosage after discussing this with her.      The blood sugar is good at 79. The creatinine, the most important kidney function test is within normal limits. The electrolytes including potassium are within normal limits, except the chloride is slightly decreased.      All liver function tests are within normal       A. Traditional cholesterol panel and triglycerides elevated at 191. He LDL C. Is elevated at 140. The HDL is good and stable at 40.----[id number] confirm that the patient is taking her cholesterol medication regularly. It should be pravastatin 40 mg--- also confirmed that she is watching her diet and getting as much exercise as possible.      The vitamin D level is good at 51.7, continue current treatment ------

## 2013-10-20 ENCOUNTER — Telehealth: Payer: Self-pay | Admitting: *Deleted

## 2013-10-20 MED ORDER — PHENYTOIN SODIUM EXTENDED 100 MG PO CAPS: 300.0000 mg | ORAL_CAPSULE | Freq: Two times a day (BID) | ORAL | Status: DC

## 2013-10-20 NOTE — Telephone Encounter (Signed)
Patient requesting Dilantin 100 mg refilled, states that she is taking 600 mg daily

## 2013-10-20 NOTE — Telephone Encounter (Signed)
Patient calling for Rx for Dilantin 100 mg, sent into her pharmacy, states she takes 600 mg daily.

## 2013-10-20 NOTE — Telephone Encounter (Signed)
RX sent

## 2013-11-14 ENCOUNTER — Ambulatory Visit: Payer: 59 | Admitting: Nurse Practitioner

## 2013-11-14 ENCOUNTER — Other Ambulatory Visit: Payer: Self-pay | Admitting: Family Medicine

## 2013-12-01 ENCOUNTER — Ambulatory Visit (INDEPENDENT_AMBULATORY_CARE_PROVIDER_SITE_OTHER): Payer: 59 | Admitting: Nurse Practitioner

## 2013-12-01 ENCOUNTER — Encounter: Payer: Self-pay | Admitting: Nurse Practitioner

## 2013-12-01 VITALS — BP 117/75 | HR 104 | Temp 97.8°F | Resp 18 | Ht 71.0 in | Wt 289.8 lb

## 2013-12-01 DIAGNOSIS — G40309 Generalized idiopathic epilepsy and epileptic syndromes, not intractable, without status epilepticus: Secondary | ICD-10-CM

## 2013-12-01 DIAGNOSIS — Z5181 Encounter for therapeutic drug level monitoring: Secondary | ICD-10-CM

## 2013-12-01 NOTE — Progress Notes (Signed)
I have read the note, and I agree with the clinical assessment and plan.  Maria Rivers,Maria Rivers   

## 2013-12-01 NOTE — Patient Instructions (Signed)
Will check Dilantin level today Will renew meds monthly once level is back Follow-up yearly and when necessary

## 2013-12-01 NOTE — Progress Notes (Signed)
[facility]  PATIENT: Maria Rivers DOB: 04/11/1960   REASON FOR VISIT: follow-up for seizure disorder  HISTORY OF PRESENT ILLNESS:Maria Rivers, 53 year old returns for follow up. She  has history of obesity but is continuing to lose weight, and was congratulated for that. She also has a  history of seizures. She was last seen 10/28/12.  Last seizure activity was a brief staring episode in July 2012, and the Dilantin level was found to be low in the 6.0 range. The patient was increased on the Dilantin. She had repeat level done 10/08/2013, in  Dr. Kathi DerMoore's office, level was 25.6. Dose was reduced to 600 mg of Dilantin daily (BRAND). The patient has done well on this dose, and denies any side effects such as drowsiness or gait instability. Will repeat level today.Patient returns for reevaluation   REVIEW OF SYSTEMS: Full 14 system review of systems performed and notable only for those listed, all others are neg:  Constitutional: N/A  Cardiovascular: N/A  Ear/Nose/Throat: N/A  Skin: N/A  Eyes: N/A  Respiratory: N/A  Gastroitestinal: N/A  Hematology/Lymphatic: N/A  Endocrine: N/A Musculoskeletal:joint pain, arthritis Allergy/Immunology: environmental allergies Neurological: N/A Psychiatric: N/A Sleep : NA   ALLERGIES: Allergies  Allergen Reactions  . Mushroom Extract Complex Swelling  . Penicillins     UNKNOWN REACTION  . Shellfish Allergy Swelling    HOME MEDICATIONS: Outpatient Prescriptions Prior to Visit  Medication Sig Dispense Refill  . cholecalciferol (VITAMIN D) 1000 UNITS tablet Take 2,000 Units by mouth daily.     . hydrochlorothiazide (HYDRODIURIL) 25 MG tablet TAKE 1 TABLET BY MOUTH ONCE DAILY 90 tablet 1  . ibuprofen (ADVIL,MOTRIN) 200 MG tablet Take 200 mg by mouth as needed. For pain    . levothyroxine (SYNTHROID, LEVOTHROID) 25 MCG tablet TAKE 1 TABLET BY MOUTH EVERY DAY 90 tablet 0  . lisinopril (PRINIVIL,ZESTRIL) 10 MG tablet TAKE 1 TABLET BY  MOUTH ONCE DAILY 90 tablet 1  . loratadine (CLARITIN) 10 MG tablet Take 10 mg by mouth daily.    . phenytoin (DILANTIN) 100 MG ER capsule Take 3 capsules (300 mg total) by mouth 2 (two) times daily. 180 capsule 2  . pravastatin (PRAVACHOL) 40 MG tablet Take 80 mg by mouth daily.      No facility-administered medications prior to visit.    PAST MEDICAL HISTORY: Past Medical History  Diagnosis Date  . Hypertension   . Seizures   . Hyperlipidemia     PAST SURGICAL HISTORY: History reviewed. No pertinent past surgical history.  FAMILY HISTORY: History reviewed. No pertinent family history.  SOCIAL HISTORY: History   Social History  . Marital Status: Married    Spouse Name: N/A    Number of Children: N/A  . Years of Education: N/A   Occupational History  . Not on file.   Social History Main Topics  . Smoking status: Never Smoker   . Smokeless tobacco: Never Used  . Alcohol Use: No  . Drug Use: No  . Sexual Activity: Yes    Birth Control/ Protection: None   Other Topics Concern  . Not on file   Social History Narrative     PHYSICAL EXAM  Filed Vitals:   12/01/13 1511  BP: 117/75  Pulse: 104  Temp: 97.8 F (36.6 C)  TempSrc: Oral  Resp: 18  Height: 5\' 11"  (1.803 m)  Weight: 289 lb 12.8 oz (131.452 kg)   Body mass index is 40.44 kg/(m^2). Generalized: Well developed, morbidly obese female  in no acute distress   Neurological examination   Mentation: Alert oriented to time, place, history taking. Follows all commands speech and language fluent  Cranial nerve II-XII: Pupils were equal round reactive to light extraocular movements were full, visual field were full on confrontational test. Facial sensation and strength were normal. hearing was intact to finger rubbing bilaterally. Uvula tongue midline. head turning and shoulder shrug and were normal and symmetric.Tongue protrusion into cheek strength was normal. Motor: normal bulk and tone, full strength in  the BUE, BLE, fine finger movements normal, no pronator drift. No focal weakness Coordination: finger-nose-finger, no dysmetria.  Reflexes: Depressed and symmetric upper and lower Gait and Station: Rising up from seated position without assistance, normal stance, moderate stride, good arm swing, smooth turning, able to perform tiptoe, and heel walking without difficulty. Tandem gait steady  DIAGNOSTIC DATA (LABS, IMAGING, TESTING) - I reviewed patient records, labs, notes, testing and imaging myself where available.walk without you well  Lab Results  Component Value Date   WBC 7.2 10/06/2013   HGB 13.0 10/06/2013   HCT 39.8 10/06/2013   MCV 90.3 10/06/2013      Component Value Date/Time   NA 137 10/06/2013 1553   K 4.6 10/06/2013 1553   CL 96* 10/06/2013 1553   CO2 24 10/06/2013 1553   GLUCOSE 79 10/06/2013 1553   BUN 28* 10/06/2013 1553   CREATININE 0.89 10/06/2013 1553   CALCIUM 9.8 10/06/2013 1553   PROT 7.3 10/06/2013 1553   AST 19 10/06/2013 1553   ALT 21 10/06/2013 1553   ALKPHOS 112 10/06/2013 1553   BILITOT 0.2 10/06/2013 1553   GFRNONAA 74 10/06/2013 1553   GFRAA 85 10/06/2013 1553   Lab Results  Component Value Date   HDL 47 10/06/2013   LDLCALC 140* 10/06/2013   TRIG 191* 10/06/2013   CHOLHDL 4.8* 10/06/2013   Lab Results  Component Value Date   HGBA1C 5.2 10/06/2013   No results found for: [id number] Lab Results  Component Value Date   TSH 3.230 06/05/2013      ASSESSMENT AND PLAN  53 y.o. year old female  has a past medical history of Hypertension; Seizures; and Hyperlipidemia. here to follow up. Dilantin level 25.6 on  10/08/2013. Dilantin dose reduced to 600 mg daily at that time.  Will check Dilantin level today Will renew meds monthly once level is back Charlsie Quest(BRAND) Follow-up yearly and when necessary Nilda RiggsNancy Carolyn Lyndzee Kliebert, [facility] LcGNP, [facility], APRN  [address] [facility] [address], [address] [place], [place] [address] [phone]

## 2013-12-02 ENCOUNTER — Telehealth: Payer: Self-pay | Admitting: Nurse Practitioner

## 2013-12-02 LAB — PHENYTOIN LEVEL, TOTAL: PHENYTOIN LVL: 14.2 ug/mL (ref 10.0–20.0)

## 2013-12-02 MED ORDER — DILANTIN 100 MG PO CAPS: 300.0000 mg | ORAL_CAPSULE | Freq: Two times a day (BID) | ORAL | Status: DC

## 2013-12-02 NOTE — Telephone Encounter (Signed)
Dilantin level good will order meds

## 2014-01-07 ENCOUNTER — Telehealth: Payer: Self-pay | Admitting: Nurse Practitioner

## 2014-01-07 NOTE — Telephone Encounter (Signed)
I called the pharmacy.  Spoke with Morrie SheldonAshley.  She verified hey do have the Rx that was sent last month for 1 year supply.  I called the patient back.  She is aware.

## 2014-01-07 NOTE — Telephone Encounter (Signed)
[facility]17 for DILANTIN 100 MG ER capsule.  Please call and advise.

## 2014-02-09 ENCOUNTER — Encounter: Payer: Self-pay | Admitting: Family Medicine

## 2014-02-09 ENCOUNTER — Ambulatory Visit (INDEPENDENT_AMBULATORY_CARE_PROVIDER_SITE_OTHER): Payer: 59 | Admitting: Family Medicine

## 2014-02-09 VITALS — BP 123/81 | HR 94 | Temp 97.2°F | Ht 71.0 in | Wt 286.0 lb

## 2014-02-09 DIAGNOSIS — E559 Vitamin D deficiency, unspecified: Secondary | ICD-10-CM

## 2014-02-09 DIAGNOSIS — E8881 Metabolic syndrome: Secondary | ICD-10-CM

## 2014-02-09 DIAGNOSIS — E785 Hyperlipidemia, unspecified: Secondary | ICD-10-CM

## 2014-02-09 DIAGNOSIS — G40909 Epilepsy, unspecified, not intractable, without status epilepticus: Secondary | ICD-10-CM

## 2014-02-09 DIAGNOSIS — E039 Hypothyroidism, unspecified: Secondary | ICD-10-CM

## 2014-02-09 DIAGNOSIS — I10 Essential (primary) hypertension: Secondary | ICD-10-CM

## 2014-02-09 LAB — POCT CBC
Granulocyte percent: 73.2 %G (ref 37–80)
HCT, POC: 43.4 % (ref 37.7–47.9)
HEMOGLOBIN: 13.4 g/dL (ref 12.2–16.2)
LYMPH, POC: 2.2 (ref 0.6–3.4)
MCH, POC: 27.6 pg (ref 27–31.2)
MCHC: 31 g/dL — AB (ref 31.8–35.4)
MCV: 89.2 fL (ref 80–97)
MPV: 7.5 fL (ref 0–99.8)
PLATELET COUNT, POC: 264 10*3/uL (ref 142–424)
POC Granulocyte: 7.4 — AB (ref 2–6.9)
POC LYMPH PERCENT: 21.4 %L (ref 10–50)
RBC: 4.9 M/uL (ref 4.04–5.48)
RDW, POC: 14.5 %
WBC: 10.1 10*3/uL (ref 4.6–10.2)

## 2014-02-09 NOTE — Patient Instructions (Addendum)
Continue current medications. Continue good therapeutic lifestyle changes which include good diet and exercise. Fall precautions discussed with patient. If an FOBT was given today- please return it to our front desk. If you are over 236 years old - you may need Prevnar 13 or the adult Pneumonia vaccine.  Flu Shots will be available at our office starting mid- September. Please call and schedule a FLU CLINIC APPOINTMENT.   Continue seizure medications as doing and we will call you with the results of your drug levels as soon as those results become available Keep appointment with neurologist Keep working on weight Stay as active as possible physically

## 2014-02-09 NOTE — Progress Notes (Signed)
Subjective:    Patient ID: Maria Rivers, female    DOB: 02-19-1960, 53 y.o.   MRN: [id number]  HPI Pt here for follow up and management of chronic medical problems which include hypothyroid, hypertension, and hyperlipidemia. She is taking medications regularly. The patient has been doing well with no specific complaints. She refuses to do any health maintenance care. We cannot convince her to change this. She continues to work and is feeling well with no complaints of chest pain and shortness of breath.digestion problems voiding problems or seizure issues. She sees Dr. Jannifer Franklin, the neurologist every 6-12 months. Any blood work that we do regarding her drug levels needs to be sent to him.           Patient Active Problem List   Diagnosis Date Noted  . Metabolic syndrome 98/33/8250  . Hyperlipidemia 11/28/2012  . Hypothyroidism 11/28/2012  . Hypertension 11/28/2012  . 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 Encounter for long-term (current) use of other medications 11/14/2012   Outpatient Encounter Prescriptions as of 02/09/2014  Medication Sig  . cholecalciferol (VITAMIN D) 1000 UNITS tablet Take 2,000 Units by mouth daily.   Marland Kitchen DILANTIN 100 MG ER capsule Take 3 capsules (300 mg total) by mouth 2 (two) times daily.  . hydrochlorothiazide (HYDRODIURIL) 25 MG tablet TAKE 1 TABLET BY MOUTH ONCE DAILY  . ibuprofen (ADVIL,MOTRIN) 200 MG tablet Take 200 mg by mouth as needed. For pain  . levothyroxine (SYNTHROID, LEVOTHROID) 25 MCG tablet TAKE 1 TABLET BY MOUTH EVERY DAY  . lisinopril (PRINIVIL,ZESTRIL) 10 MG tablet TAKE 1 TABLET BY MOUTH ONCE DAILY  . loratadine (CLARITIN) 10 MG tablet Take 10 mg by mouth daily.  . pravastatin (PRAVACHOL) 40 MG tablet Take 80 mg by mouth daily.     Review of Systems  Constitutional: Negative.   HENT: Negative.   Eyes: Negative.   Respiratory: Negative.   Cardiovascular: Negative.   Gastrointestinal: Negative.   Endocrine: Negative.     Genitourinary: Negative.   Musculoskeletal: Negative.   Skin: Negative.   Allergic/Immunologic: Negative.   Neurological: Negative.   Hematological: Negative.   Psychiatric/Behavioral: Negative.        Objective:   Physical Exam  Constitutional: She is oriented to person, place, and time. She appears well-developed and well-nourished. No distress.  HENT:  Head: Normocephalic and atraumatic.  Right Ear: External ear normal.  Left Ear: External ear normal.  Nose: Nose normal.  Mouth/Throat: Oropharynx is clear and moist.  Eyes: Conjunctivae and EOM are normal. Pupils are equal, round, and reactive to light. Right eye exhibits no discharge. Left eye exhibits no discharge. No scleral icterus.  Neck: Normal range of motion. Neck supple. No thyromegaly present.  No carotid bruits or thyromegaly or anterior cervical adenopathy  Cardiovascular: Normal rate, regular rhythm and normal heart sounds.  Exam reveals no gallop and no friction rub.   No murmur heard. The heart has a regular rate and rhythm at 72/m  Pulmonary/Chest: Effort normal and breath sounds normal. No respiratory distress. She has no wheezes. She has no rales. She exhibits no tenderness.  Both axillary regions are negative for adenopathy Lungs are clear anteriorly and posteriorly  Abdominal: Soft. Bowel sounds are normal. She exhibits no mass. There is no tenderness. There is no rebound and no guarding.  The abdomen remains obese without masses tenderness or organ enlargement  Musculoskeletal: Normal range of motion. She exhibits no edema or tenderness.  Lymphadenopathy:    She has no cervical adenopathy.  Neurological: She is alert and oriented to person, place, and time.  Skin: Skin is warm and dry. No rash noted.  Psychiatric: She has a normal mood and affect. Her behavior is normal. Judgment and thought content normal.  Nursing note and vitals reviewed.  BP 123/81 mmHg  Pulse 94  Temp(Src) 97.2 F (36.2 C) (Oral)   Ht _0  (1.803 m)  Wt 286 lb (129.729 kg)  BMI 39.91 kg/m2  LMP 03/16/2011        Assessment & Plan:  1. Hyperlipidemia -Continue current medication pending lab work results. This will be pravastatin 80 mg daily. - POCT CBC - Lipid panel  2. Essential hypertension -Continue sodium restriction and lisinopril and hydrochlorothiazide - POCT CBC - BMP8+EGFR - Hepatic function panel  3. Hypothyroidism, unspecified hypothyroidism type -Continue levothyroxine and we will let you know about the lab work once that is returned if any adjustments need to be made - POCT CBC  4. Vitamin D deficiency -Continue vitamin D 2000 units daily - POCT CBC - Vit D  25 hydroxy (rtn osteoporosis monitoring)  5. Metabolic syndrome -Continue weight MEASURES aggressive therapeutic lifestyle changes with diet - POCT CBC  6. Seizure disorder -Drug levels for seizure meds will be done today any adjustment in his medication will be recommended and we will send a copy of the results to your neurologist - POCT CBC - Phenytoin level, total and free  No orders of the defined types were placed in this encounter.   Patient Instructions  Continue current medications. Continue good therapeutic lifestyle changes which include good diet and exercise. Fall precautions discussed with patient. If an FOBT was given today- please return it to our front desk. If you are over 27 years old - you may need Prevnar 11 or the adult Pneumonia vaccine.  Flu Shots will be available at our office starting mid- September. Please call and schedule a FLU CLINIC APPOINTMENT.   Continue seizure medications as doing and we will call you with the results of your drug levels as soon as those results become available Keep appointment with neurologist Keep working on weight Stay as active as possible physically   Arrie Senate MD

## 2014-02-10 ENCOUNTER — Telehealth: Payer: Self-pay | Admitting: Family Medicine

## 2014-02-10 ENCOUNTER — Other Ambulatory Visit: Payer: Self-pay | Admitting: Family Medicine

## 2014-02-10 LAB — BMP8+EGFR
BUN / CREAT RATIO: 30 — AB (ref 9–23)
BUN: 25 mg/dL — AB (ref 6–24)
CO2: 23 mmol/L (ref 18–29)
Calcium: 10.2 mg/dL (ref 8.7–10.2)
Chloride: 97 mmol/L (ref 97–108)
Creatinine, Ser: 0.84 mg/dL (ref 0.57–1.00)
GFR calc Af Amer: 90 mL/min/{1.73_m2} (ref >59)
GFR calc non Af Amer: 78 mL/min/{1.73_m2} (ref >59)
Glucose: 90 mg/dL (ref 65–99)
Potassium: 4.1 mmol/L (ref 3.5–5.2)
Sodium: 138 mmol/L (ref 134–144)

## 2014-02-10 LAB — HEPATIC FUNCTION PANEL
ALBUMIN: 4.3 g/dL (ref 3.5–5.5)
ALK PHOS: 123 IU/L — AB (ref 39–117)
ALT: 25 IU/L (ref 0–32)
AST: 18 IU/L (ref 0–40)
BILIRUBIN DIRECT: 0.1 mg/dL (ref 0.00–0.40)
Total Bilirubin: 0.2 mg/dL (ref 0.0–1.2)
Total Protein: 7.5 g/dL (ref 6.0–8.5)

## 2014-02-10 LAB — LIPID PANEL
CHOL/HDL RATIO: 4 ratio (ref 0.0–4.4)
Cholesterol, Total: 206 mg/dL — ABNORMAL HIGH (ref 100–199)
HDL: 51 mg/dL (ref >39)
LDL Calculated: 124 mg/dL — ABNORMAL HIGH (ref 0–99)
TRIGLYCERIDES: 154 mg/dL — AB (ref 0–149)
VLDL Cholesterol Cal: 31 mg/dL (ref 5–40)

## 2014-02-10 LAB — PHENYTOIN LEVEL, FREE
Phenytoin, Free: 1 ug/mL (ref 1.0–2.0)
Phenytoin: 14 ug/mL (ref 10.0–20.0)

## 2014-02-10 LAB — VITAMIN D 25 HYDROXY (VIT D DEFICIENCY, FRACTURES): Vit D, 25-Hydroxy: 48.4 ng/mL (ref 30.0–100.0)

## 2014-02-10 NOTE — Telephone Encounter (Signed)
Spoke with pt

## 2014-02-11 ENCOUNTER — Telehealth: Payer: Self-pay | Admitting: Family Medicine

## 2014-02-11 NOTE — Telephone Encounter (Signed)
Patient aware of results.

## 2014-04-28 ENCOUNTER — Telehealth: Payer: Self-pay | Admitting: *Deleted

## 2014-04-28 NOTE — Telephone Encounter (Signed)
[facility] JonesCarolyn 04-28-14.

## 2014-04-29 DIAGNOSIS — Z0289 Encounter for other administrative examinations: Secondary | ICD-10-CM

## 2014-05-05 NOTE — Telephone Encounter (Signed)
Form was completed and given to MR on 05/01/14.

## 2014-05-05 NOTE — Telephone Encounter (Signed)
Form,DMV received,completed by Jeralene Huffarolyn and Casandra faxed 05/04/14.

## 2014-05-13 ENCOUNTER — Telehealth: Payer: Self-pay | Admitting: Family Medicine

## 2014-05-13 MED ORDER — HYDROCHLOROTHIAZIDE 25 MG PO TABS: 25.0000 mg | ORAL_TABLET | Freq: Every day | ORAL | Status: DC

## 2014-05-13 MED ORDER — LISINOPRIL 10 MG PO TABS: 10.0000 mg | ORAL_TABLET | Freq: Every day | ORAL | Status: DC

## 2014-05-13 NOTE — Telephone Encounter (Signed)
done

## 2014-06-25 ENCOUNTER — Ambulatory Visit: Payer: 59 | Admitting: Family Medicine

## 2014-07-10 ENCOUNTER — Encounter: Payer: Self-pay | Admitting: Family Medicine

## 2014-07-10 ENCOUNTER — Ambulatory Visit (INDEPENDENT_AMBULATORY_CARE_PROVIDER_SITE_OTHER): Payer: 59 | Admitting: Family Medicine

## 2014-07-10 VITALS — BP 101/67 | HR 93 | Temp 98.0°F | Ht 71.0 in | Wt 292.0 lb

## 2014-07-10 DIAGNOSIS — E8881 Metabolic syndrome: Secondary | ICD-10-CM | POA: Diagnosis not present

## 2014-07-10 DIAGNOSIS — E559 Vitamin D deficiency, unspecified: Secondary | ICD-10-CM | POA: Diagnosis not present

## 2014-07-10 DIAGNOSIS — G4700 Insomnia, unspecified: Secondary | ICD-10-CM | POA: Diagnosis not present

## 2014-07-10 DIAGNOSIS — I10 Essential (primary) hypertension: Secondary | ICD-10-CM | POA: Diagnosis not present

## 2014-07-10 DIAGNOSIS — E039 Hypothyroidism, unspecified: Secondary | ICD-10-CM | POA: Diagnosis not present

## 2014-07-10 DIAGNOSIS — G40909 Epilepsy, unspecified, not intractable, without status epilepticus: Secondary | ICD-10-CM | POA: Diagnosis not present

## 2014-07-10 DIAGNOSIS — E785 Hyperlipidemia, unspecified: Secondary | ICD-10-CM | POA: Diagnosis not present

## 2014-07-10 LAB — POCT CBC
Granulocyte percent: 75.7 %G (ref 37–80)
HCT, POC: 42.1 % (ref 37.7–47.9)
Hemoglobin: 13.5 g/dL (ref 12.2–16.2)
Lymph, poc: 2 (ref 0.6–3.4)
MCH: 28.5 pg (ref 27–31.2)
MCHC: 32 g/dL (ref 31.8–35.4)
MCV: 89.1 fL (ref 80–97)
MPV: 7.7 fL (ref 0–99.8)
POC Granulocyte: 7.9 — AB (ref 2–6.9)
POC LYMPH PERCENT: 19.7 %L (ref 10–50)
Platelet Count, POC: 264 10*3/uL (ref 142–424)
RBC: 4.73 M/uL (ref 4.04–5.48)
RDW, POC: 14.4 %
WBC: 10.4 10*3/uL — AB (ref 4.6–10.2)

## 2014-07-10 MED ORDER — SUVOREXANT 10 MG PO TABS: 1.0000 | ORAL_TABLET | Freq: Every evening | ORAL | Status: DC | PRN

## 2014-07-10 NOTE — Progress Notes (Signed)
Subjective:    Patient ID: Maria Rivers, female    DOB: 12-10-1960, 54 y.o.   MRN: [id number]  HPI Pt here for follow up and management of chronic medical problems which includes hypertension, seizures, hyperlipidemia, and hypothyroid. She is taking medications regularly. The patient is doing well since she is under a lot of stress at work because she is carrying a heavy load. Because of this she's having problems with sleeping. She is not having any seizure activity and she continues to take her current medications. Chest pain shortness of breath control swallowing problems with her bowel movements or problems with voiding. As usual she refuses Pap and pelvic exams mammograms DEXA scans chest x-rays an FOBT's. She'll get traditional lab work today.      Patient Active Problem List   Diagnosis Date Noted  . Metabolic syndrome 18/56/3149  . Hyperlipidemia 11/28/2012  . Hypothyroidism 11/28/2012  . Hypertension 11/28/2012  . 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 Encounter for long-term (current) use of other medications 11/14/2012   Outpatient Encounter Prescriptions as of 07/10/2014  Medication Sig  . cholecalciferol (VITAMIN D) 1000 UNITS tablet Take 2,000 Units by mouth daily.   Marland Kitchen DILANTIN 100 MG ER capsule Take 3 capsules (300 mg total) by mouth 2 (two) times daily.  . hydrochlorothiazide (HYDRODIURIL) 25 MG tablet Take 1 tablet (25 mg total) by mouth daily.  Marland Kitchen ibuprofen (ADVIL,MOTRIN) 200 MG tablet Take 200 mg by mouth as needed. For pain  . levothyroxine (SYNTHROID, LEVOTHROID) 25 MCG tablet TAKE 1 TABLET BY MOUTH EVERY DAY  . lisinopril (PRINIVIL,ZESTRIL) 10 MG tablet Take 1 tablet (10 mg total) by mouth daily.  Marland Kitchen loratadine (CLARITIN) 10 MG tablet Take 10 mg by mouth daily.  . pravastatin (PRAVACHOL) 40 MG tablet TAKE 2 TABLETS BY MOUTH DAILY.  . [DISCONTINUED] pravastatin (PRAVACHOL) 40 MG tablet Take 80 mg by mouth daily.    No facility-administered encounter  medications on file as of 07/10/2014.     Review of Systems  Constitutional: Negative.   HENT: Negative.   Eyes: Negative.   Respiratory: Negative.   Cardiovascular: Negative.   Gastrointestinal: Negative.   Endocrine: Negative.   Genitourinary: Negative.   Musculoskeletal: Negative.   Skin: Negative.   Allergic/Immunologic: Negative.   Neurological: Negative.   Hematological: Negative.   Psychiatric/Behavioral: Positive for sleep disturbance.       Objective:   Physical Exam  Constitutional: She is oriented to person, place, and time. She appears well-developed and well-nourished. She appears distressed.  The patient remains pleasant and alert and somewhat stressed today because of situations at work and being overworked.  HENT:  Head: Normocephalic and atraumatic.  Right Ear: External ear normal.  Left Ear: External ear normal.  Nose: Nose normal.  Mouth/Throat: Oropharynx is clear and moist.  Eyes: Conjunctivae and EOM are normal. Pupils are equal, round, and reactive to light. Right eye exhibits no discharge. Left eye exhibits no discharge. No scleral icterus.  Neck: Normal range of motion. Neck supple. No thyromegaly present.  Without bruits or thyromegaly or adenopathy  Cardiovascular: Normal rate, regular rhythm, normal heart sounds and intact distal pulses.   No murmur heard. Pulmonary/Chest: Effort normal and breath sounds normal. No respiratory distress. She has no wheezes. She has no rales. She exhibits no tenderness.  Clear anteriorly and posteriorly  Abdominal: Soft. Bowel sounds are normal. She exhibits no mass. There is no tenderness. There is no rebound and no guarding.  Morbid obesity without masses tenderness  or organ enlargement  Musculoskeletal: Normal range of motion. She exhibits no edema or tenderness.  Lymphadenopathy:    She has no cervical adenopathy.  Neurological: She is alert and oriented to person, place, and time. She has normal reflexes. No  cranial nerve deficit.  Skin: Skin is warm and dry. No rash noted.  Psychiatric: She has a normal mood and affect. Her behavior is normal. Judgment and thought content normal.  Nursing note and vitals reviewed.  BP 101/67 mmHg  Pulse 93  Temp(Src) 98 F (36.7 C) (Oral)  Ht '5\' 11"'  (1.803 m)  Wt 292 lb (132.45 kg)  BMI 40.74 kg/m2  LMP 03/16/2011        Assessment & Plan:  1. Hyperlipidemia -Continue current treatment pending lab work being done today -POCT CBC  - Lipid panel  2. Essential hypertension -The blood pressure is good today and she should continue with her lisinopril - POCT CBC - BMP8+EGFR - Hepatic function panel  3. Hypothyroidism, unspecified hypothyroidism type - POCT CBC - Thyroid Panel With TSH  4. Vitamin D deficiency -Continue vitamin D3 1000 daily - POCT CBC - Vit D  25 hydroxy (rtn osteoporosis monitoring)  5. Metabolic syndrome -The BMI is very elevated and the patient needs to work much much more aggressively on getting her weight down through watching her diet and exercise - POCT CBC - BMP8+EGFR  6. Seizure disorder -No history of any seizure since last visit. She should continue to follow-up with the neurologist. - POCT CBC - Phenytoin level, free  7. Insomnia -She will try belsomra  8. Morbid obesity --Any to work on diet and exercise  Patient Instructions  Continue current medications. Continue good therapeutic lifestyle changes which include good diet and exercise. Fall precautions discussed with patient. If an FOBT was given today- please return it to our front desk. If you are over 32 years old - you may need Prevnar 53 or the adult Pneumonia vaccine.  Flu Shots are still available at our office. If you still haven't had one please call to set up a nurse visit to get one.   After your visit with [place] today you will receive a survey in the mail or online from [employer] regarding your care with [place]. Please take a moment to fill  this out. Your feedback is very important to [place] as you can help [place] better understand your patient needs as well as improve your experience and satisfaction. WE CARE ABOUT YOU!!!   Continue current medications We will call you in the lab work is returned We will make sure that Dr. Jannifer Franklin gets a copy of the lab work Try the new sleeping pill as directed to see if this helps with your insomnia Hopefully with time the issues at work will straighten out he will not need this medication anymore If it is too expensive with your insurance, please call us and we will try something different Keep working on drinking plenty of water and watching what you eat.   Arrie Senate MD

## 2014-07-10 NOTE — Patient Instructions (Addendum)
Continue current medications. Continue good therapeutic lifestyle changes which include good diet and exercise. Fall precautions discussed with patient. If an FOBT was given today- please return it to our front desk. If you are over 56 years old - you may need Prevnar 13 or the adult Pneumonia vaccine.  Flu Shots are still available at our office. If you still haven't had one please call to set up a nurse visit to get one.   After your visit with [place] today you will receive a survey in the mail or online from [employer] regarding your care with [place]. Please take a moment to fill this out. Your feedback is very important to [place] as you can help [place] better understand your patient needs as well as improve your experience and satisfaction. WE CARE ABOUT YOU!!!   Continue current medications We will call you in the lab work is returned We will make sure that Dr. Anne Hahn gets a copy of the lab work Try the new sleeping pill as directed to see if this helps with your insomnia Hopefully with time the issues at work will straighten out he will not need this medication anymore If it is too expensive with your insurance, please call us and we will try something different Keep working on drinking plenty of water and watching what you eat.

## 2014-07-13 LAB — BMP8+EGFR
BUN/Creatinine Ratio: 29 — ABNORMAL HIGH (ref 9–23)
BUN: 24 mg/dL (ref 6–24)
CHLORIDE: 95 mmol/L — AB (ref 97–108)
CO2: 25 mmol/L (ref 18–29)
Calcium: 9.7 mg/dL (ref 8.7–10.2)
Creatinine, Ser: 0.83 mg/dL (ref 0.57–1.00)
GFR calc Af Amer: 92 mL/min/{1.73_m2} (ref >59)
GFR, EST NON AFRICAN AMERICAN: 80 mL/min/{1.73_m2} (ref >59)
GLUCOSE: 98 mg/dL (ref 65–99)
Potassium: 4.4 mmol/L (ref 3.5–5.2)
SODIUM: 137 mmol/L (ref 134–144)

## 2014-07-13 LAB — PHENYTOIN LEVEL, FREE AND TOTAL
PHENYTOIN FREE: 1 ug/mL (ref 1.0–2.0)
PHENYTOIN: 14.3 ug/mL (ref 10.0–20.0)

## 2014-07-13 LAB — LIPID PANEL
Chol/HDL Ratio: 4.2 ratio units (ref 0.0–4.4)
Cholesterol, Total: 205 mg/dL — ABNORMAL HIGH (ref 100–199)
HDL: 49 mg/dL (ref >39)
LDL CALC: 116 mg/dL — AB (ref 0–99)
Triglycerides: 199 mg/dL — ABNORMAL HIGH (ref 0–149)
VLDL CHOLESTEROL CAL: 40 mg/dL (ref 5–40)

## 2014-07-13 LAB — HEPATIC FUNCTION PANEL
ALT: 23 IU/L (ref 0–32)
AST: 20 IU/L (ref 0–40)
Albumin: 4.1 g/dL (ref 3.5–5.5)
Alkaline Phosphatase: 103 IU/L (ref 39–117)
Bilirubin Total: 0.2 mg/dL (ref 0.0–1.2)
Bilirubin, Direct: 0.1 mg/dL (ref 0.00–0.40)
Total Protein: 7.4 g/dL (ref 6.0–8.5)

## 2014-07-13 LAB — THYROID PANEL WITH TSH
Free Thyroxine Index: 1.7 (ref 1.2–4.9)
T3 Uptake Ratio: 24 % (ref 24–39)
T4 TOTAL: 7 ug/dL (ref 4.5–12.0)
TSH: 1.96 u[IU]/mL (ref 0.450–4.500)

## 2014-07-13 LAB — VITAMIN D 25 HYDROXY (VIT D DEFICIENCY, FRACTURES): Vit D, 25-Hydroxy: 42.2 ng/mL (ref 30.0–100.0)

## 2014-07-16 ENCOUNTER — Telehealth: Payer: Self-pay | Admitting: Family Medicine

## 2014-07-17 ENCOUNTER — Other Ambulatory Visit: Payer: Self-pay | Admitting: *Deleted

## 2014-07-17 ENCOUNTER — Telehealth: Payer: Self-pay | Admitting: Family Medicine

## 2014-07-17 MED ORDER — ALPRAZOLAM 0.25 MG PO TABS: ORAL_TABLET | ORAL | Status: DC

## 2014-07-17 NOTE — Telephone Encounter (Signed)
[facility], pt aware.

## 2014-07-17 NOTE — Telephone Encounter (Signed)
Patient says Belsomra is no good.   She did not rest any better and was "hateful" per her husband.  She would prefer to try the smallest dose of xanax  To see if it would work.

## 2014-07-17 NOTE — Telephone Encounter (Signed)
Please call a prescription in or give her a prescription for Xanax 0.25 one half to one at bedtime as needed #30

## 2014-08-03 ENCOUNTER — Telehealth: Payer: Self-pay | Admitting: Family Medicine

## 2014-08-03 MED ORDER — HYDROCHLOROTHIAZIDE 25 MG PO TABS: 25.0000 mg | ORAL_TABLET | Freq: Every day | ORAL | Status: DC

## 2014-08-03 MED ORDER — LEVOTHYROXINE SODIUM 25 MCG PO TABS: 25.0000 ug | ORAL_TABLET | Freq: Every day | ORAL | Status: DC

## 2014-08-03 MED ORDER — LISINOPRIL 10 MG PO TABS: 10.0000 mg | ORAL_TABLET | Freq: Every day | ORAL | Status: DC

## 2014-08-03 MED ORDER — PRAVASTATIN SODIUM 40 MG PO TABS: 80.0000 mg | ORAL_TABLET | Freq: Every day | ORAL | Status: DC

## 2014-08-03 NOTE — Telephone Encounter (Signed)
done

## 2014-11-12 NOTE — Telephone Encounter (Signed)
Error

## 2014-11-27 ENCOUNTER — Ambulatory Visit (INDEPENDENT_AMBULATORY_CARE_PROVIDER_SITE_OTHER): Payer: 59 | Admitting: Family Medicine

## 2014-11-27 ENCOUNTER — Encounter: Payer: Self-pay | Admitting: Family Medicine

## 2014-11-27 VITALS — BP 111/74 | HR 95 | Temp 97.5°F | Ht 71.0 in | Wt 294.0 lb

## 2014-11-27 DIAGNOSIS — E039 Hypothyroidism, unspecified: Secondary | ICD-10-CM | POA: Diagnosis not present

## 2014-11-27 DIAGNOSIS — E559 Vitamin D deficiency, unspecified: Secondary | ICD-10-CM

## 2014-11-27 DIAGNOSIS — I10 Essential (primary) hypertension: Secondary | ICD-10-CM | POA: Diagnosis not present

## 2014-11-27 DIAGNOSIS — G40909 Epilepsy, unspecified, not intractable, without status epilepticus: Secondary | ICD-10-CM | POA: Diagnosis not present

## 2014-11-27 DIAGNOSIS — E8881 Metabolic syndrome: Secondary | ICD-10-CM | POA: Diagnosis not present

## 2014-11-27 DIAGNOSIS — E785 Hyperlipidemia, unspecified: Secondary | ICD-10-CM | POA: Diagnosis not present

## 2014-11-27 MED ORDER — PRAVASTATIN SODIUM 40 MG PO TABS: 80.0000 mg | ORAL_TABLET | Freq: Every day | ORAL | Status: DC

## 2014-11-27 MED ORDER — DILANTIN 100 MG PO CAPS: 300.0000 mg | ORAL_CAPSULE | Freq: Two times a day (BID) | ORAL | Status: DC

## 2014-11-27 MED ORDER — LEVOTHYROXINE SODIUM 25 MCG PO TABS: 25.0000 ug | ORAL_TABLET | Freq: Every day | ORAL | Status: DC

## 2014-11-27 MED ORDER — HYDROCHLOROTHIAZIDE 25 MG PO TABS: 25.0000 mg | ORAL_TABLET | Freq: Every day | ORAL | Status: DC

## 2014-11-27 MED ORDER — LISINOPRIL 10 MG PO TABS: 10.0000 mg | ORAL_TABLET | Freq: Every day | ORAL | Status: DC

## 2014-11-27 NOTE — Addendum Note (Signed)
Addended by: Prescott GumLAND, Christl Fessenden M on: 11/27/2014 04:18 PM   Modules accepted: Kipp BroodSmartSet

## 2014-11-27 NOTE — Progress Notes (Signed)
Subjective:    Patient ID: Maria Rivers, female    DOB: Apr 16, 1960, 54 y.o.   MRN: [id number]  HPI Pt here for follow up and management of chronic medical problems which includes hyperlipidemia. She is taking medications regularly. This patient also has a history of a seizure disorder and is followed regularly by Dr. Jannifer Franklin, neurologist. The patient has no specific complaints. The patient refuses to get a pelvic exam Pap smear mammogram DEXA scan chest x-ray and FOBT. She will get her lab work today. She will check the cost of the Prevnar vaccine. She got the flu shot at work. The patient continues to try to work on her weight. She eats out a lot and knows that this is the reason she has not lost any. She denies any chest pain shortness of breath, swallowing heartburn indigestion nausea vomiting diarrhea or blood in the stool. She is passing her water without problems. She absolutely refuses to do any of the preventative things that are necessary including mammogram Pap pelvic fecal occult blood test and chest x-ray. The patient denies having any seizure activity. She comes to the visit today with her husband.       Patient Active Problem List   Diagnosis Date Noted  . Metabolic syndrome 93/71/6967  . Hyperlipidemia 11/28/2012  . Hypothyroidism 11/28/2012  . Hypertension 11/28/2012  . Generalized convulsive epilepsy ([place]) 11/14/2012  . Encounter for long-term (current) use of other medications 11/14/2012   Outpatient Encounter Prescriptions as of 11/27/2014  Medication Sig  . ALPRAZolam (XANAX) 0.25 MG tablet Take one half to one tablet at bedtime as needed  . cholecalciferol (VITAMIN D) 1000 UNITS tablet Take 2,000 Units by mouth daily.   Marland Kitchen DILANTIN 100 MG ER capsule Take 3 capsules (300 mg total) by mouth 2 (two) times daily.  . hydrochlorothiazide (HYDRODIURIL) 25 MG tablet Take 1 tablet (25 mg total) by mouth daily.  Marland Kitchen ibuprofen (ADVIL,MOTRIN) 200 MG tablet Take 200 mg by mouth as  needed. For pain  . levothyroxine (SYNTHROID, LEVOTHROID) 25 MCG tablet Take 1 tablet (25 mcg total) by mouth daily.  Marland Kitchen lisinopril (PRINIVIL,ZESTRIL) 10 MG tablet Take 1 tablet (10 mg total) by mouth daily.  Marland Kitchen loratadine (CLARITIN) 10 MG tablet Take 10 mg by mouth daily.  . pravastatin (PRAVACHOL) 40 MG tablet Take 2 tablets (80 mg total) by mouth daily.  . [DISCONTINUED] Suvorexant (BELSOMRA) 10 MG TABS Take 1 tablet by mouth at bedtime as needed.   No facility-administered encounter medications on file as of 11/27/2014.     Review of Systems  Constitutional: Negative.   HENT: Negative.   Eyes: Negative.   Respiratory: Negative.   Cardiovascular: Negative.   Gastrointestinal: Negative.   Endocrine: Negative.   Genitourinary: Negative.   Musculoskeletal: Negative.   Skin: Negative.   Allergic/Immunologic: Negative.   Neurological: Negative.   Hematological: Negative.   Psychiatric/Behavioral: Negative.        Objective:   Physical Exam  Constitutional: She is oriented to person, place, and time. She appears well-developed and well-nourished. No distress.  HENT:  Head: Normocephalic and atraumatic.  Right Ear: External ear normal.  Left Ear: External ear normal.  Mouth/Throat: Oropharynx is clear and moist.  Nasal congestion and turbinate swelling bilaterally  Eyes: Conjunctivae and EOM are normal. Pupils are equal, round, and reactive to light. Right eye exhibits no discharge. Left eye exhibits no discharge. No scleral icterus.  Neck: Normal range of motion. Neck supple. No thyromegaly present.  No  bruits thyromegaly or anterior cervical adenopathy  Cardiovascular: Normal rate, regular rhythm, normal heart sounds and intact distal pulses.   No murmur heard. At 72/m  Pulmonary/Chest: Effort normal and breath sounds normal. No respiratory distress. She has no wheezes. She has no rales. She exhibits no tenderness.  Clear anteriorly and posteriorly  Abdominal: Soft. Bowel  sounds are normal. She exhibits no mass. There is no tenderness. There is no rebound and no guarding.  Morbidly obese. No spleen or liver enlargement. No epigastric tenderness. No abdominal bruits and no suprapubic tenderness.  Musculoskeletal: Normal range of motion. She exhibits no edema.  Lymphadenopathy:    She has no cervical adenopathy.  Neurological: She is alert and oriented to person, place, and time. She has normal reflexes. No cranial nerve deficit.  Skin: Skin is warm and dry. No rash noted.  Psychiatric: She has a normal mood and affect. Her behavior is normal. Judgment and thought content normal.  Nursing note and vitals reviewed.  BP 111/74 mmHg  Pulse 95  Temp(Src) 97.5 F (36.4 C) (Oral)  Ht '5\' 11"'  (1.803 m)  Wt 294 lb (133.358 kg)  BMI 41.02 kg/m2  LMP 03/16/2011        Assessment & Plan:  1. Essential hypertension -The blood pressure is good today the patient should continue with her current treatment - BMP8+EGFR - CBC with Differential/Platelet - Hepatic function panel  2. Hyperlipidemia -Continue with aggressive therapeutic lifestyle changes which include his diet and exercise pending results of lab work - CBC with Differential/Platelet - Lipid panel  3. Hypothyroidism, unspecified hypothyroidism type -Continue with current treatment pending results of lab work - CBC with Differential/Platelet  4. Vitamin D deficiency -Continue D3, 1000 units 1 daily pending results of lab work - CBC with Differential/Platelet - VITAMIN D 25 Hydroxy (Vit-D Deficiency, Fractures)  5. Metabolic syndrome -Aggressive therapeutic lifestyle changes - CBC with Differential/Platelet  6. Seizure disorder ([place]) -Follow up with neurology as planned - CBC with Differential/Platelet - Phenytoin level, free and total  7. Morbid obesity due to excess calories ([place]) -Must work on trying to lose weight!!!!!  Meds ordered this encounter  Medications  . hydrochlorothiazide  (HYDRODIURIL) 25 MG tablet    Sig: Take 1 tablet (25 mg total) by mouth daily.    Dispense:  90 tablet    Refill:  3  . lisinopril (PRINIVIL,ZESTRIL) 10 MG tablet    Sig: Take 1 tablet (10 mg total) by mouth daily.    Dispense:  90 tablet    Refill:  3  . pravastatin (PRAVACHOL) 40 MG tablet    Sig: Take 2 tablets (80 mg total) by mouth daily.    Dispense:  180 tablet    Refill:  3  . levothyroxine (SYNTHROID, LEVOTHROID) 25 MCG tablet    Sig: Take 1 tablet (25 mcg total) by mouth daily.    Dispense:  90 tablet    Refill:  3  . DILANTIN 100 MG ER capsule    Sig: Take 3 capsules (300 mg total) by mouth 2 (two) times daily.    Dispense:  180 capsule    Refill:  11   Patient Instructions  Continue current medications. Continue good therapeutic lifestyle changes which include good diet and exercise. Fall precautions discussed with patient. If an FOBT was given today- please return it to our front desk. If you are over 58 years old - you may need Prevnar 38 or the adult Pneumonia vaccine.  **Flu shots are available---  please call and schedule a FLU-CLINIC appointment**  After your visit with [place] today you will receive a survey in the mail or online from [employer] regarding your care with [place]. Please take a moment to fill this out. Your feedback is very important to [place] as you can help [place] better understand your patient needs as well as improve your experience and satisfaction. WE CARE ABOUT YOU!!!   Keep appointment with neurology Reconsider getting your Pap smear and mammogram DEXA scan chest x-ray and FOBT--- we will be happy to do these anytime Try to work on the weight situation continue to drink plenty of water and try to eat more salads and no carbs and less desserts   Arrie Senate MD

## 2014-11-27 NOTE — Patient Instructions (Addendum)
Continue current medications. Continue good therapeutic lifestyle changes which include good diet and exercise. Fall precautions discussed with patient. If an FOBT was given today- please return it to our front desk. If you are over 56 years old - you may need Prevnar 13 or the adult Pneumonia vaccine.  **Flu shots are available--- please call and schedule a FLU-CLINIC appointment**  After your visit with us today you will receive a survey in the mail or online from [employer] regarding your care with us. Please take a moment to fill this out. Your feedback is very important to us as you can help us better understand your patient needs as well as improve your experience and satisfaction. WE CARE ABOUT YOU!!!   Keep appointment with neurology Reconsider getting your Pap smear and mammogram DEXA scan chest x-ray and FOBT--- we will be happy to do these anytime Try to work on the weight situation continue to drink plenty of water and try to eat more salads and no carbs and less desserts

## 2014-12-01 ENCOUNTER — Telehealth: Payer: Self-pay | Admitting: Family Medicine

## 2014-12-01 LAB — BMP8+EGFR
BUN/Creatinine Ratio: 29 — ABNORMAL HIGH (ref 9–23)
BUN: 26 mg/dL — ABNORMAL HIGH (ref 6–24)
CO2: 24 mmol/L (ref 18–29)
Calcium: 9.8 mg/dL (ref 8.7–10.2)
Chloride: 97 mmol/L (ref 97–106)
Creatinine, Ser: 0.91 mg/dL (ref 0.57–1.00)
GFR calc Af Amer: 82 mL/min/{1.73_m2} (ref >59)
GFR calc non Af Amer: 71 mL/min/{1.73_m2} (ref >59)
Glucose: 100 mg/dL — ABNORMAL HIGH (ref 65–99)
Potassium: 4.3 mmol/L (ref 3.5–5.2)
SODIUM: 138 mmol/L (ref 136–144)

## 2014-12-01 LAB — CBC WITH DIFFERENTIAL/PLATELET
BASOS ABS: 0 10*3/uL (ref 0.0–0.2)
Basos: 0 %
EOS (ABSOLUTE): 0.3 10*3/uL (ref 0.0–0.4)
Eos: 4 %
Hematocrit: 40.8 % (ref 34.0–46.6)
Hemoglobin: 13.5 g/dL (ref 11.1–15.9)
Immature Grans (Abs): 0 10*3/uL (ref 0.0–0.1)
Immature Granulocytes: 0 %
Lymphocytes Absolute: 2.1 10*3/uL (ref 0.7–3.1)
Lymphs: 27 %
MCH: 28.8 pg (ref 26.6–33.0)
MCHC: 33.1 g/dL (ref 31.5–35.7)
MCV: 87 fL (ref 79–97)
MONOS ABS: 0.5 10*3/uL (ref 0.1–0.9)
Monocytes: 6 %
NEUTROS PCT: 63 %
Neutrophils Absolute: 5.1 10*3/uL (ref 1.4–7.0)
Platelets: 244 10*3/uL (ref 150–379)
RBC: 4.69 x10E6/uL (ref 3.77–5.28)
RDW: 14.8 % (ref 12.3–15.4)
WBC: 8 10*3/uL (ref 3.4–10.8)

## 2014-12-01 LAB — HEPATIC FUNCTION PANEL
ALBUMIN: 4.2 g/dL (ref 3.5–5.5)
ALT: 22 IU/L (ref 0–32)
AST: 21 IU/L (ref 0–40)
Alkaline Phosphatase: 114 IU/L (ref 39–117)
BILIRUBIN TOTAL: 0.2 mg/dL (ref 0.0–1.2)
Bilirubin, Direct: 0.1 mg/dL (ref 0.00–0.40)
TOTAL PROTEIN: 7.5 g/dL (ref 6.0–8.5)

## 2014-12-01 LAB — LIPID PANEL
CHOL/HDL RATIO: 4.9 ratio — AB (ref 0.0–4.4)
Cholesterol, Total: 209 mg/dL — ABNORMAL HIGH (ref 100–199)
HDL: 43 mg/dL (ref >39)
LDL CALC: 121 mg/dL — AB (ref 0–99)
Triglycerides: 226 mg/dL — ABNORMAL HIGH (ref 0–149)
VLDL Cholesterol Cal: 45 mg/dL — ABNORMAL HIGH (ref 5–40)

## 2014-12-01 LAB — VITAMIN D 25 HYDROXY (VIT D DEFICIENCY, FRACTURES): Vit D, 25-Hydroxy: 38.9 ng/mL (ref 30.0–100.0)

## 2014-12-01 LAB — PHENYTOIN LEVEL, FREE AND TOTAL
Phenytoin, Free: 1.1 ug/mL (ref 1.0–2.0)
Phenytoin, Serum: 14.9 ug/mL (ref 10.0–20.0)

## 2014-12-03 ENCOUNTER — Ambulatory Visit (INDEPENDENT_AMBULATORY_CARE_PROVIDER_SITE_OTHER): Payer: 59 | Admitting: Nurse Practitioner

## 2014-12-03 ENCOUNTER — Encounter: Payer: Self-pay | Admitting: Nurse Practitioner

## 2014-12-03 VITALS — BP 133/80 | HR 89 | Ht 71.0 in | Wt 299.0 lb

## 2014-12-03 DIAGNOSIS — G40309 Generalized idiopathic epilepsy and epileptic syndromes, not intractable, without status epilepticus: Secondary | ICD-10-CM | POA: Diagnosis not present

## 2014-12-03 MED ORDER — DILANTIN 100 MG PO CAPS: 300.0000 mg | ORAL_CAPSULE | Freq: Two times a day (BID) | ORAL | Status: DC

## 2014-12-03 NOTE — Progress Notes (Signed)
I have read the note, and I agree with the clinical assessment and plan.  Abundio Teuscher KEITH   

## 2014-12-03 NOTE — Patient Instructions (Signed)
Will renew meds monthly Dilantin (BRAND) Follow-up yearly and when necessary

## 2014-12-03 NOTE — Progress Notes (Signed)
[facility]  PATIENT: Maria Rivers DOB: 06/23/1960   REASON FOR VISIT: Follow-up for generalized epilepsy HISTORY FROM: History from patient    HISTORY OF PRESENT ILLNESS:Maria Rivers, 54 year old returns for follow up.  She has a history of seizures. She was last seen 12/01/13 Last seizure activity was a brief staring episode in July 2012, and the Dilantin level was found to be low in the 6.0 range. The patient was increased on the Dilantin. She had repeat level done 10/08/2013, in Dr. Kathi DerMoore's office, level was 25.6. Dose was reduced to 600 mg of Dilantin daily (BRAND). The patient has done well on this dose, and denies any side effects such as drowsiness or gait instability. CBC and liver function done in Dr. Kathi DerMoore's office 11/27/2014 were within normal limits. Dilantin level 14.9 .Patient returns for reevaluationand refills    REVIEW OF SYSTEMS: Full 14 system review of systems performed and notable only for those listed, all others are neg:  Constitutional: neg  Cardiovascular: neg Ear/Nose/Throat: neg  Skin: neg Eyes: neg Respiratory: neg Gastroitestinal: neg  Hematology/Lymphatic: neg  Endocrine: neg Musculoskeletal:neg Allergy/Immunology: neg Neurological: neg Psychiatric: neg Sleep : neg   ALLERGIES: Allergies  Allergen Reactions  . Mushroom Extract Complex Swelling  . Penicillins     UNKNOWN REACTION  . Shellfish Allergy Swelling    HOME MEDICATIONS: Outpatient Prescriptions Prior to Visit  Medication Sig Dispense Refill  . ALPRAZolam (XANAX) 0.25 MG tablet Take one half to one tablet at bedtime as needed 30 tablet 0  . cholecalciferol (VITAMIN D) 1000 UNITS tablet Take 2,000 Units by mouth daily.     Marland Kitchen. DILANTIN 100 MG ER capsule Take 3 capsules (300 mg total) by mouth 2 (two) times daily. 180 capsule 11  . hydrochlorothiazide (HYDRODIURIL) 25 MG tablet Take 1 tablet (25 mg total) by mouth daily. 90 tablet 3  . ibuprofen (ADVIL,MOTRIN)  200 MG tablet Take 200 mg by mouth as needed. For pain    . levothyroxine (SYNTHROID, LEVOTHROID) 25 MCG tablet Take 1 tablet (25 mcg total) by mouth daily. 90 tablet 3  . lisinopril (PRINIVIL,ZESTRIL) 10 MG tablet Take 1 tablet (10 mg total) by mouth daily. 90 tablet 3  . loratadine (CLARITIN) 10 MG tablet Take 10 mg by mouth daily.    . pravastatin (PRAVACHOL) 40 MG tablet Take 2 tablets (80 mg total) by mouth daily. 180 tablet 3   No facility-administered medications prior to visit.    PAST MEDICAL HISTORY: Past Medical History  Diagnosis Date  . Hypertension   . Seizures (HCC)   . Hyperlipidemia     PAST SURGICAL HISTORY: History reviewed. No pertinent past surgical history.  FAMILY HISTORY: History reviewed. No pertinent family history.  SOCIAL HISTORY: Social History   Social History  . Marital Status: Married    Spouse Name: N/A  . Number of Children: N/A  . Years of Education: N/A   Occupational History  . Not on file.   Social History Main Topics  . Smoking status: Never Smoker   . Smokeless tobacco: Never Used  . Alcohol Use: No  . Drug Use: No  . Sexual Activity: Yes    Birth Control/ Protection: None   Other Topics Concern  . Not on file   Social History Narrative     PHYSICAL EXAM  Filed Vitals:   12/03/14 1511  BP: 133/80  Pulse: 89  Height: 5\' 11"  (1.803 m)  Weight: 299 lb (135.626 kg)  Body mass index is 41.72 kg/(m^2). Generalized: Well developed, morbidly obese female in no acute distress   Neurological examination   Mentation: Alert oriented to time, place, history taking. Follows all commands speech and language fluent  Cranial nerve II-XII: Pupils were equal round reactive to light extraocular movements were full, visual field were full on confrontational test. Facial sensation and strength were normal. hearing was intact to finger rubbing bilaterally. Uvula tongue midline. head turning and shoulder shrug and were normal and  symmetric.Tongue protrusion into cheek strength was normal. Motor: normal bulk and tone, full strength in the BUE, BLE, fine finger movements normal, no pronator drift. No focal weakness Coordination: finger-nose-finger, no dysmetria.  Reflexes: Depressed and symmetric upper and lower Gait and Station: Rising up from seated position without assistance, normal stance, moderate stride, good arm swing, smooth turning, able to perform tiptoe, and heel walking without difficulty. Tandem gait steady   DIAGNOSTIC DATA (LABS, IMAGING, TESTING) - I reviewed patient records, labs, notes, testing and imaging myself where available.  Lab Results  Component Value Date   WBC 8.0 11/27/2014   HGB 13.5 07/10/2014   HCT 40.8 11/27/2014   MCV 89.1 07/10/2014      Component Value Date/Time   NA 138 11/27/2014 1616   K 4.3 11/27/2014 1616   CL 97 11/27/2014 1616   CO2 24 11/27/2014 1616   GLUCOSE 100* 11/27/2014 1616   BUN 26* 11/27/2014 1616   CREATININE 0.91 11/27/2014 1616   CALCIUM 9.8 11/27/2014 1616   PROT 7.5 11/27/2014 1616   ALBUMIN 4.2 11/27/2014 1616   AST 21 11/27/2014 1616   ALT 22 11/27/2014 1616   ALKPHOS 114 11/27/2014 1616   BILITOT 0.2 11/27/2014 1616   BILITOT 0.2 02/09/2014 1642   GFRNONAA 71 11/27/2014 1616   GFRAA 82 11/27/2014 1616   Lab Results  Component Value Date   CHOL 209* 11/27/2014   HDL 43 11/27/2014   LDLCALC 121* 11/27/2014   TRIG 226* 11/27/2014   CHOLHDL 4.9* 11/27/2014    Lab Results  Component Value Date   TSH 1.960 07/10/2014      ASSESSMENT AND PLAN  54 y.o. year old female  has a past medical history of Hypertension; Seizures (HCC); and Hyperlipidemia. here to follow-up.  PLAN: Will renew meds monthly (BRAND) Dilantin level 14.9 on 11/27/2014 CBC and Liver function WNL Follow-up yearly and when necessary [name], [facility], [facility], APRN  [facility] [address], [address] [place], [place] [address] [phone]

## 2014-12-08 NOTE — Telephone Encounter (Signed)
Pt aware of labs  

## 2015-02-05 ENCOUNTER — Telehealth: Payer: Self-pay | Admitting: Family Medicine

## 2015-02-05 ENCOUNTER — Other Ambulatory Visit: Payer: Self-pay

## 2015-02-05 MED FILL — DILANTIN 100 MG CAPSULE: 100 | 30 days supply | Qty: 180 | Fill #2

## 2015-02-05 MED FILL — LEVOTHYROXINE 25 MCG TABLET: 25 | 90 days supply | Qty: 90 | Fill #2

## 2015-02-08 MED FILL — PRAVASTATIN NA 40 MG TAB: 40 | 90 days supply | Qty: 180 | Fill #0

## 2015-02-08 MED FILL — LISINOPRIL 10 MG TABLET: 10 | 90 days supply | Qty: 90 | Fill #0

## 2015-02-08 MED FILL — HYDROCHLOROTHIAZIDE 25 MG T: 25 | 90 days supply | Qty: 90 | Fill #0

## 2015-02-18 NOTE — Telephone Encounter (Signed)
We have not received any thing yet. Pt aware

## 2015-03-04 ENCOUNTER — Telehealth: Payer: Self-pay | Admitting: Family Medicine

## 2015-03-04 NOTE — Telephone Encounter (Signed)
DENIED 

## 2015-03-05 MED FILL — DILANTIN 100 MG CAPSULE: 100 | 30 days supply | Qty: 180 | Fill #3

## 2015-03-24 ENCOUNTER — Other Ambulatory Visit: Payer: Self-pay | Admitting: Family Medicine

## 2015-03-24 NOTE — Telephone Encounter (Signed)
Patient aware, needs to be seen.  She said she would keep her appointment next week.

## 2015-04-08 ENCOUNTER — Encounter: Payer: Self-pay | Admitting: Family Medicine

## 2015-04-08 ENCOUNTER — Ambulatory Visit (INDEPENDENT_AMBULATORY_CARE_PROVIDER_SITE_OTHER): Payer: 59 | Admitting: Family Medicine

## 2015-04-08 VITALS — BP 119/79 | HR 93 | Temp 98.2°F | Ht 71.0 in | Wt 297.0 lb

## 2015-04-08 DIAGNOSIS — G40309 Generalized idiopathic epilepsy and epileptic syndromes, not intractable, without status epilepticus: Secondary | ICD-10-CM | POA: Diagnosis not present

## 2015-04-08 DIAGNOSIS — G40909 Epilepsy, unspecified, not intractable, without status epilepticus: Secondary | ICD-10-CM

## 2015-04-08 DIAGNOSIS — I10 Essential (primary) hypertension: Secondary | ICD-10-CM | POA: Diagnosis not present

## 2015-04-08 DIAGNOSIS — E039 Hypothyroidism, unspecified: Secondary | ICD-10-CM | POA: Diagnosis not present

## 2015-04-08 DIAGNOSIS — E559 Vitamin D deficiency, unspecified: Secondary | ICD-10-CM

## 2015-04-08 DIAGNOSIS — E785 Hyperlipidemia, unspecified: Secondary | ICD-10-CM

## 2015-04-08 MED ORDER — HYDROCHLOROTHIAZIDE 25 MG PO TABS: 25.0000 mg | ORAL_TABLET | Freq: Every day | ORAL | Status: DC

## 2015-04-08 MED ORDER — PRAVASTATIN SODIUM 40 MG PO TABS: 80.0000 mg | ORAL_TABLET | Freq: Every day | ORAL | Status: DC

## 2015-04-08 MED ORDER — LISINOPRIL 10 MG PO TABS: 10.0000 mg | ORAL_TABLET | Freq: Every day | ORAL | Status: DC

## 2015-04-08 MED ORDER — LEVOTHYROXINE SODIUM 25 MCG PO TABS: 25.0000 ug | ORAL_TABLET | Freq: Every day | ORAL | Status: DC

## 2015-04-08 NOTE — Patient Instructions (Addendum)
Continue current medications. Continue good therapeutic lifestyle changes which include good diet and exercise. Fall precautions discussed with patient. If an FOBT was given today- please return it to our front desk. If you are over 57 years old - you may need Prevnar 13 or the adult Pneumonia vaccine.  **Flu shots are available--- please call and schedule a FLU-CLINIC appointment**  After your visit with us today you will receive a survey in the mail or online from [employer] regarding your care with us. Please take a moment to fill this out. Your feedback is very important to us as you can help us better understand your patient needs as well as improve your experience and satisfaction. WE CARE ABOUT YOU!!!   Continue to follow-up with the neurologist on a yearly basis We will send a copy of the blood levels of your seizure medicine to him for review Stay active physically and drink plenty of fluids Reconsider getting mammograms pelvic exams.

## 2015-04-08 NOTE — Progress Notes (Signed)
Subjective:    Patient ID: Maria Rivers, female    DOB: 17-Mar-1960, 55 y.o.   MRN: [id number]  HPI Pt here for follow up and management of chronic medical problems which includes hypertension, hyperlipidemia, and hypothyroid. She is taking medications regularly.The patient is doing well with no complaints or problems. She continues to refuse getting a DEXA scan and mammogram and a pelvic exam. A chest x-ray is due to be done that she refuses to do this. She will get lab work today. We will refill her medicines. The patient is doing well overall and she had what may been a case of the flu recently. She denies any chest pain shortness of breath trouble swallowing heartburn indigestion nausea vomiting diarrhea or blood in the stool. She denies any seizure activity.      Patient Active Problem List   Diagnosis Date Noted  . Metabolic syndrome 01/75/1025  . Hyperlipidemia 11/28/2012  . Hypothyroidism 11/28/2012  . Hypertension 11/28/2012  . 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 Encounter for long-term (current) use of other medications 11/14/2012   Outpatient Encounter Prescriptions as of 04/08/2015  Medication Sig  . ALPRAZolam (XANAX) 0.25 MG tablet Take one half to one tablet at bedtime as needed  . cholecalciferol (VITAMIN D) 1000 UNITS tablet Take 2,000 Units by mouth daily.   Marland Kitchen DILANTIN 100 MG ER capsule Take 3 capsules (300 mg total) by mouth 2 (two) times daily.  . hydrochlorothiazide (HYDRODIURIL) 25 MG tablet Take 1 tablet (25 mg total) by mouth daily.  Marland Kitchen ibuprofen (ADVIL,MOTRIN) 200 MG tablet Take 200 mg by mouth as needed. For pain  . levothyroxine (SYNTHROID, LEVOTHROID) 25 MCG tablet Take 1 tablet (25 mcg total) by mouth daily.  Marland Kitchen lisinopril (PRINIVIL,ZESTRIL) 10 MG tablet Take 1 tablet (10 mg total) by mouth daily.  Marland Kitchen loratadine (CLARITIN) 10 MG tablet Take 10 mg by mouth daily.  . pravastatin (PRAVACHOL) 40 MG tablet Take 2 tablets (80 mg total) by mouth  daily.   No facility-administered encounter medications on file as of 04/08/2015.      Review of Systems  Constitutional: Negative.   HENT: Negative.   Eyes: Negative.   Respiratory: Negative.   Cardiovascular: Negative.   Gastrointestinal: Negative.   Endocrine: Negative.   Genitourinary: Negative.   Musculoskeletal: Negative.   Skin: Negative.   Allergic/Immunologic: Negative.   Neurological: Negative.   Hematological: Negative.   Psychiatric/Behavioral: Negative.        Objective:   Physical Exam  Constitutional: She is oriented to person, place, and time. She appears well-developed and well-nourished. No distress.  HENT:  Head: Normocephalic and atraumatic.  Right Ear: External ear normal.  Left Ear: External ear normal.  Mouth/Throat: Oropharynx is clear and moist. No oropharyngeal exudate.  Slight nasal congestion bilaterally  Eyes: Conjunctivae and EOM are normal. Pupils are equal, round, and reactive to light. Right eye exhibits no discharge. Left eye exhibits no discharge. No scleral icterus.  Neck: Normal range of motion. Neck supple. No thyromegaly present.  Cardiovascular: Normal rate, regular rhythm, normal heart sounds and intact distal pulses.   No murmur heard. Pulmonary/Chest: Effort normal and breath sounds normal. No respiratory distress. She has no wheezes. She has no rales.  Clear anteriorly and posteriorly  Abdominal: Soft. Bowel sounds are normal. She exhibits no mass. There is no tenderness. There is no rebound and no guarding.  Continue abdominal obesity without spleen or liver enlargement and no abdominal tenderness or suprapubic tenderness.  Musculoskeletal:  Normal range of motion. She exhibits no edema or tenderness.  Lymphadenopathy:    She has no cervical adenopathy.  Neurological: She is alert and oriented to person, place, and time. She has normal reflexes. No cranial nerve deficit.  Skin: Skin is warm and dry. No rash noted.  Psychiatric:  She has a normal mood and affect. Her behavior is normal. Judgment and thought content normal.  Nursing note and vitals reviewed.  BP 119/79 mmHg  Pulse 93  Temp(Src) 98.2 F (36.8 C) (Oral)  Ht _0  (1.803 m)  Wt 297 lb (134.718 kg)  BMI 41.44 kg/m2  LMP 03/16/2011        Assessment & Plan:  1. Hyperlipidemia -Continue pravastatin pending results of lab work - Lipid panel - CBC with Differential/Platelet  2. Essential hypertension -The blood pressure is good today and she should continue with her lisinopril - BMP8+EGFR - CBC with Differential/Platelet - Hepatic function panel  3. Vitamin D deficiency -Continue current treatment pending results of lab work - CBC with Differential/Platelet - VITAMIN D 25 Hydroxy (Vit-D Deficiency, Fractures)  4. Hypothyroidism, unspecified hypothyroidism type -Continue current treatment pending results of lab work - CBC with Differential/Platelet - Thyroid Panel With TSH  5. Seizure disorder ([place]) -Continue to follow-up with neurology - CBC with Differential/Platelet - Phenytoin level, free and total  6. Generalized convulsive epilepsy ([place]) -She has not had any seizures and she will continue current treatment and follow-up with neurology as recommended  Meds ordered this encounter  Medications  . hydrochlorothiazide (HYDRODIURIL) 25 MG tablet    Sig: Take 1 tablet (25 mg total) by mouth daily.    Dispense:  90 tablet    Refill:  3  . levothyroxine (SYNTHROID, LEVOTHROID) 25 MCG tablet    Sig: Take 1 tablet (25 mcg total) by mouth daily.    Dispense:  90 tablet    Refill:  3  . lisinopril (PRINIVIL,ZESTRIL) 10 MG tablet    Sig: Take 1 tablet (10 mg total) by mouth daily.    Dispense:  90 tablet    Refill:  3  . pravastatin (PRAVACHOL) 40 MG tablet    Sig: Take 2 tablets (80 mg total) by mouth daily.    Dispense:  180 tablet    Refill:  3   Patient Instructions  Continue current medications. Continue good therapeutic  lifestyle changes which include good diet and exercise. Fall precautions discussed with patient. If an FOBT was given today- please return it to our front desk. If you are over 60 years old - you may need Prevnar 78 or the adult Pneumonia vaccine.  **Flu shots are available--- please call and schedule a FLU-CLINIC appointment**  After your visit with [place] today you will receive a survey in the mail or online from [employer] regarding your care with [place]. Please take a moment to fill this out. Your feedback is very important to [place] as you can help [place] better understand your patient needs as well as improve your experience and satisfaction. WE CARE ABOUT YOU!!!   Continue to follow-up with the neurologist on a yearly basis We will send a copy of the blood levels of your seizure medicine to him for review Stay active physically and drink plenty of fluids Reconsider getting mammograms pelvic exams.   Arrie Senate MD

## 2015-04-09 LAB — CBC WITH DIFFERENTIAL/PLATELET
BASOS: 0 %
Basophils Absolute: 0 10*3/uL (ref 0.0–0.2)
EOS (ABSOLUTE): 0.1 10*3/uL (ref 0.0–0.4)
EOS: 2 %
HEMATOCRIT: 38.1 % (ref 34.0–46.6)
HEMOGLOBIN: 12.8 g/dL (ref 11.1–15.9)
Immature Grans (Abs): 0 10*3/uL (ref 0.0–0.1)
Immature Granulocytes: 0 %
LYMPHS ABS: 1.6 10*3/uL (ref 0.7–3.1)
Lymphs: 24 %
MCH: 28.4 pg (ref 26.6–33.0)
MCHC: 33.6 g/dL (ref 31.5–35.7)
MCV: 85 fL (ref 79–97)
MONOCYTES: 7 %
Monocytes Absolute: 0.5 10*3/uL (ref 0.1–0.9)
NEUTROS ABS: 4.3 10*3/uL (ref 1.4–7.0)
Neutrophils: 67 %
Platelets: 235 10*3/uL (ref 150–379)
RBC: 4.51 x10E6/uL (ref 3.77–5.28)
RDW: 15.1 % (ref 12.3–15.4)
WBC: 6.5 10*3/uL (ref 3.4–10.8)

## 2015-04-09 LAB — BMP8+EGFR
BUN / CREAT RATIO: 28 — AB (ref 9–23)
BUN: 27 mg/dL — AB (ref 6–24)
CO2: 23 mmol/L (ref 18–29)
CREATININE: 0.96 mg/dL (ref 0.57–1.00)
Calcium: 9.2 mg/dL (ref 8.7–10.2)
Chloride: 97 mmol/L (ref 96–106)
GFR, EST AFRICAN AMERICAN: 76 mL/min/{1.73_m2} (ref >59)
GFR, EST NON AFRICAN AMERICAN: 66 mL/min/{1.73_m2} (ref >59)
Glucose: 96 mg/dL (ref 65–99)
Potassium: 4.1 mmol/L (ref 3.5–5.2)
Sodium: 138 mmol/L (ref 134–144)

## 2015-04-09 LAB — LIPID PANEL
CHOLESTEROL TOTAL: 208 mg/dL — AB (ref 100–199)
Chol/HDL Ratio: 5.2 ratio units — ABNORMAL HIGH (ref 0.0–4.4)
HDL: 40 mg/dL (ref >39)
LDL Calculated: 116 mg/dL — ABNORMAL HIGH (ref 0–99)
TRIGLYCERIDES: 260 mg/dL — AB (ref 0–149)
VLDL Cholesterol Cal: 52 mg/dL — ABNORMAL HIGH (ref 5–40)

## 2015-04-09 LAB — HEPATIC FUNCTION PANEL
ALBUMIN: 3.7 g/dL (ref 3.5–5.5)
ALK PHOS: 93 IU/L (ref 39–117)
ALT: 20 IU/L (ref 0–32)
AST: 22 IU/L (ref 0–40)
Bilirubin, Direct: 0.09 mg/dL (ref 0.00–0.40)
Total Protein: 6.9 g/dL (ref 6.0–8.5)

## 2015-04-09 LAB — PHENYTOIN LEVEL, FREE AND TOTAL
PHENYTOIN FREE: 0.9 ug/mL — AB (ref 1.0–2.0)
PHENYTOIN, SERUM: 11.3 ug/mL (ref 10.0–20.0)

## 2015-04-09 LAB — THYROID PANEL WITH TSH
Free Thyroxine Index: 1.5 (ref 1.2–4.9)
T3 Uptake Ratio: 22 % — ABNORMAL LOW (ref 24–39)
T4 TOTAL: 7 ug/dL (ref 4.5–12.0)
TSH: 2.35 u[IU]/mL (ref 0.450–4.500)

## 2015-04-09 LAB — VITAMIN D 25 HYDROXY (VIT D DEFICIENCY, FRACTURES): Vit D, 25-Hydroxy: 41.9 ng/mL (ref 30.0–100.0)

## 2015-04-12 MED FILL — DILANTIN 100 MG CAPSULE: 100 | 30 days supply | Qty: 180 | Fill #4

## 2015-05-06 MED FILL — PRAVASTATIN NA 40 MG TAB: 40 | 90 days supply | Qty: 180 | Fill #1

## 2015-05-06 MED FILL — LISINOPRIL 10 MG TABLET: 10 | 90 days supply | Qty: 90 | Fill #1

## 2015-05-06 MED FILL — HYDROCHLOROTHIAZIDE 25 MG T: 25 | 90 days supply | Qty: 90 | Fill #1

## 2015-05-06 MED FILL — LEVOTHYROXINE 25 MCG TABLET: 25 | 90 days supply | Qty: 90 | Fill #3

## 2015-05-07 MED FILL — DILANTIN 100 MG CAPSULE: 100 | 30 days supply | Qty: 180 | Fill #5

## 2015-06-09 MED FILL — DILANTIN 100 MG CAPSULE: 100 | 30 days supply | Qty: 180 | Fill #6

## 2015-07-05 MED FILL — DILANTIN 100 MG CAPSULE: 100 | 30 days supply | Qty: 180 | Fill #7

## 2015-08-09 MED FILL — DILANTIN 100 MG CAPSULE: 100 | 30 days supply | Qty: 180 | Fill #8

## 2015-08-09 MED FILL — PRAVASTATIN NA 40 MG TAB: 40 | 90 days supply | Qty: 180 | Fill #2

## 2015-08-09 MED FILL — HYDROCHLOROTHIAZIDE 25 MG T: 25 | 90 days supply | Qty: 90 | Fill #2

## 2015-08-09 MED FILL — LISINOPRIL 10 MG TABLET: 10 | 90 days supply | Qty: 90 | Fill #2

## 2015-08-12 ENCOUNTER — Encounter: Payer: Self-pay | Admitting: Family Medicine

## 2015-08-12 ENCOUNTER — Ambulatory Visit (INDEPENDENT_AMBULATORY_CARE_PROVIDER_SITE_OTHER): Payer: 59 | Admitting: Family Medicine

## 2015-08-12 VITALS — BP 125/91 | HR 85 | Temp 97.1°F | Ht 71.0 in | Wt 300.0 lb

## 2015-08-12 DIAGNOSIS — E039 Hypothyroidism, unspecified: Secondary | ICD-10-CM

## 2015-08-12 DIAGNOSIS — I10 Essential (primary) hypertension: Secondary | ICD-10-CM | POA: Diagnosis not present

## 2015-08-12 DIAGNOSIS — E785 Hyperlipidemia, unspecified: Secondary | ICD-10-CM | POA: Diagnosis not present

## 2015-08-12 DIAGNOSIS — G40909 Epilepsy, unspecified, not intractable, without status epilepticus: Secondary | ICD-10-CM | POA: Diagnosis not present

## 2015-08-12 DIAGNOSIS — E559 Vitamin D deficiency, unspecified: Secondary | ICD-10-CM | POA: Diagnosis not present

## 2015-08-12 NOTE — Patient Instructions (Addendum)
   Continue current medications. Continue good therapeutic lifestyle changes which include good diet and exercise. Fall precautions discussed with patient. If an FOBT was given today- please return it to our front desk. If you are over 57 years old - you may need Prevnar 13 or the adult Pneumonia vaccine.   After your visit with [place] today you will receive a survey in the mail or online from [employer] regarding your care with [place]. Please take a moment to fill this out. Your feedback is very important to [place] as you can help [place] better understand your patient needs as well as improve your experience and satisfaction. WE CARE ABOUT YOU!!!   The patient is talking about joining Weight Wat[employer] and I encouraged her to do this. She is hoping to get her husband to work with this to see they can both lose weight together. She will see her neurologist and/or his assistant this fall with a Dilantin level that we do and today's blood work. She should continue aggressive therapeutic lifestyle changes

## 2015-08-12 NOTE — Progress Notes (Signed)
Subjective:    Patient ID: Maria Rivers, female    DOB: February 07, 1960, 55 y.o.   MRN: [id number]  HPI Pt here for follow up and management of chronic medical problems which includes hypertension and hyperlipidemia. She is taking medications regularly.The patient is doing well with no particular complaints. She still refuses to do any preventative measures including Pap smear mammogram DEXA scan chest x-ray and FOBT. She'll get traditional lab work today and is requesting refills on several of her medicines. The patient denies chest pain shortness of breath trouble swallowing heartburn indigestion nausea vomiting diarrhea or blood in the stool. She has not had any seizure activity. She sees the neurologist or his assistant sometime this fall. She sees them once a year. We will make sure they get a copy of the Dilantin level.    Patient Active Problem List   Diagnosis Date Noted  . Metabolic syndrome 78/29/5621  . Hyperlipidemia 11/28/2012  . Hypothyroidism 11/28/2012  . Hypertension 11/28/2012  . 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 Encounter for long-term (current) use of other medications 11/14/2012   Outpatient Encounter Prescriptions as of 08/12/2015  Medication Sig  . ALPRAZolam (XANAX) 0.25 MG tablet Take one half to one tablet at bedtime as needed  . cholecalciferol (VITAMIN D) 1000 UNITS tablet Take 2,000 Units by mouth daily.   Marland Kitchen DILANTIN 100 MG ER capsule Take 3 capsules (300 mg total) by mouth 2 (two) times daily.  . hydrochlorothiazide (HYDRODIURIL) 25 MG tablet Take 1 tablet (25 mg total) by mouth daily.  Marland Kitchen ibuprofen (ADVIL,MOTRIN) 200 MG tablet Take 200 mg by mouth as needed. For pain  . levothyroxine (SYNTHROID, LEVOTHROID) 25 MCG tablet Take 1 tablet (25 mcg total) by mouth daily.  Marland Kitchen lisinopril (PRINIVIL,ZESTRIL) 10 MG tablet Take 1 tablet (10 mg total) by mouth daily.  Marland Kitchen loratadine (CLARITIN) 10 MG tablet Take 10 mg by mouth daily.  . pravastatin (PRAVACHOL)  40 MG tablet Take 2 tablets (80 mg total) by mouth daily.   No facility-administered encounter medications on file as of 08/12/2015.        Review of Systems  Constitutional: Negative.   HENT: Negative.   Eyes: Negative.   Respiratory: Negative.   Cardiovascular: Negative.   Gastrointestinal: Negative.   Endocrine: Negative.   Genitourinary: Negative.   Musculoskeletal: Negative.   Skin: Negative.   Allergic/Immunologic: Negative.   Neurological: Negative.   Hematological: Negative.   Psychiatric/Behavioral: Negative.        Objective:   Physical Exam  Constitutional: She is oriented to person, place, and time. She appears well-developed and well-nourished. No distress.  Pleasant and alert and refusing to do any of the health maintenance that we've asked her to do.  HENT:  Head: Normocephalic and atraumatic.  Right Ear: External ear normal.  Left Ear: External ear normal.  Nose: Nose normal.  Mouth/Throat: Oropharynx is clear and moist.  Eyes: Conjunctivae and EOM are normal. Pupils are equal, round, and reactive to light. Right eye exhibits no discharge. Left eye exhibits no discharge. No scleral icterus.  Neck: Normal range of motion. Neck supple. No thyromegaly present.  No thyromegaly or anterior cervical adenopathy. No bruits.  Cardiovascular: Normal rate, regular rhythm, normal heart sounds and intact distal pulses.   No murmur heard. Pulmonary/Chest: Effort normal and breath sounds normal. No respiratory distress. She has no wheezes. She has no rales. She exhibits no tenderness.  Clear anteriorly and posteriorly  Abdominal: Soft. Bowel sounds are normal. She  exhibits no mass. There is no tenderness. There is no rebound and no guarding.  Morbidly obese without masses tenderness or organ enlargement. No bruits.  Musculoskeletal: Normal range of motion. She exhibits no edema.  Lymphadenopathy:    She has no cervical adenopathy.  Neurological: She is alert and  oriented to person, place, and time. She has normal reflexes. No cranial nerve deficit.  Skin: Skin is warm and dry. No rash noted.  Psychiatric: She has a normal mood and affect. Her behavior is normal. Judgment and thought content normal.  Nursing note and vitals reviewed.  BP (!) 125/91 (BP Location: Left Arm)   Pulse 85   Temp 97.1 F (36.2 C) (Oral)   Ht '5\' 11"'  (1.803 m)   Wt 300 lb (136.1 kg)   LMP 03/16/2011   BMI 41.84 kg/m         Assessment & Plan:  1. Hyperlipidemia -Continue with pravastatin pending results of lab work. Continue with aggressive therapeutic lifestyle changes. - Lipid panel - CBC with Differential/Platelet  2. Essential hypertension -The diastolic blood pressure is slightly elevated today but we will make no changes in treatment. She will continue with sodium restriction and current medication which includes lisinopril. - BMP8+EGFR - CBC with Differential/Platelet - Hepatic function panel  3. Vitamin D deficiency -Continue with vitamin D replacement pending results of lab work - CBC with Differential/Platelet - VITAMIN D 25 Hydroxy (Vit-D Deficiency, Fractures)  4. Hypothyroidism, unspecified hypothyroidism type -Continue with thyroid treatment pending results of lab work - CBC with Differential/Platelet - Thyroid Panel With TSH  5. Seizure disorder ([place]) -The patient has had no seizures and we'll continue with current treatment and will follow-up with the neurologist this fall. - CBC with Differential/Platelet - Phenytoin level, free and total  6. Morbid obesity due to excess calories [facility]) -The patient will consider joining Weight Wat[employer] and hoping to have success with more weight loss. She should increase her activity level.  No orders of the defined types were placed in this encounter.  Patient Instructions    Continue current medications. Continue good therapeutic lifestyle changes which include good diet and exercise. Fall  precautions discussed with patient. If an FOBT was given today- please return it to our front desk. If you are over 43 years old - you may need Prevnar 53 or the adult Pneumonia vaccine.   After your visit with [place] today you will receive a survey in the mail or online from [employer] regarding your care with [place]. Please take a moment to fill this out. Your feedback is very important to [place] as you can help [place] better understand your patient needs as well as improve your experience and satisfaction. WE CARE ABOUT YOU!!!   The patient is talking about joining [employer] and I encouraged her to do this. She is hoping to get her husband to work with this to see they can both lose weight together. She will see her neurologist and/or his assistant this fall with a Dilantin level that we do and today's blood work. She should continue aggressive therapeutic lifestyle changes    Arrie Senate MD

## 2015-08-13 LAB — CBC WITH DIFFERENTIAL/PLATELET
BASOS ABS: 0 10*3/uL (ref 0.0–0.2)
Basos: 0 %
EOS (ABSOLUTE): 0.2 10*3/uL (ref 0.0–0.4)
EOS: 3 %
HEMATOCRIT: 39.4 % (ref 34.0–46.6)
HEMOGLOBIN: 13.4 g/dL (ref 11.1–15.9)
Immature Grans (Abs): 0 10*3/uL (ref 0.0–0.1)
Immature Granulocytes: 0 %
LYMPHS ABS: 2.2 10*3/uL (ref 0.7–3.1)
Lymphs: 28 %
MCH: 30.2 pg (ref 26.6–33.0)
MCHC: 34 g/dL (ref 31.5–35.7)
MCV: 89 fL (ref 79–97)
MONOS ABS: 0.6 10*3/uL (ref 0.1–0.9)
Monocytes: 7 %
NEUTROS ABS: 4.8 10*3/uL (ref 1.4–7.0)
Neutrophils: 62 %
Platelets: 215 10*3/uL (ref 150–379)
RBC: 4.44 x10E6/uL (ref 3.77–5.28)
RDW: 14.5 % (ref 12.3–15.4)
WBC: 7.8 10*3/uL (ref 3.4–10.8)

## 2015-08-13 LAB — HEPATIC FUNCTION PANEL
ALBUMIN: 4.2 g/dL (ref 3.5–5.5)
ALK PHOS: 108 IU/L (ref 39–117)
ALT: 24 IU/L (ref 0–32)
AST: 24 IU/L (ref 0–40)
Bilirubin Total: 0.2 mg/dL (ref 0.0–1.2)
Bilirubin, Direct: 0.1 mg/dL (ref 0.00–0.40)
Total Protein: 7.7 g/dL (ref 6.0–8.5)

## 2015-08-13 LAB — LIPID PANEL
CHOL/HDL RATIO: 5 ratio — AB (ref 0.0–4.4)
Cholesterol, Total: 211 mg/dL — ABNORMAL HIGH (ref 100–199)
HDL: 42 mg/dL (ref >39)
LDL Calculated: 124 mg/dL — ABNORMAL HIGH (ref 0–99)
TRIGLYCERIDES: 224 mg/dL — AB (ref 0–149)
VLDL CHOLESTEROL CAL: 45 mg/dL — AB (ref 5–40)

## 2015-08-13 LAB — BMP8+EGFR
BUN / CREAT RATIO: 32 — AB (ref 9–23)
BUN: 29 mg/dL — ABNORMAL HIGH (ref 6–24)
CHLORIDE: 93 mmol/L — AB (ref 96–106)
CO2: 26 mmol/L (ref 18–29)
Calcium: 9.6 mg/dL (ref 8.7–10.2)
Creatinine, Ser: 0.9 mg/dL (ref 0.57–1.00)
GFR calc non Af Amer: 72 mL/min/{1.73_m2} (ref >59)
GFR, EST AFRICAN AMERICAN: 83 mL/min/{1.73_m2} (ref >59)
GLUCOSE: 89 mg/dL (ref 65–99)
Potassium: 4.4 mmol/L (ref 3.5–5.2)
SODIUM: 134 mmol/L (ref 134–144)

## 2015-08-13 LAB — VITAMIN D 25 HYDROXY (VIT D DEFICIENCY, FRACTURES): VIT D 25 HYDROXY: 40 ng/mL (ref 30.0–100.0)

## 2015-08-13 LAB — THYROID PANEL WITH TSH
Free Thyroxine Index: 1.8 (ref 1.2–4.9)
T3 Uptake Ratio: 26 % (ref 24–39)
T4 TOTAL: 6.9 ug/dL (ref 4.5–12.0)
TSH: 3.58 u[IU]/mL (ref 0.450–4.500)

## 2015-08-13 LAB — PHENYTOIN LEVEL, FREE AND TOTAL
PHENYTOIN, SERUM: 18.1 ug/mL (ref 10.0–20.0)
Phenytoin, Free: 1.2 ug/mL (ref 1.0–2.0)

## 2015-08-13 MED FILL — LEVOTHYROXINE 25 MCG TABLET: 25 | 90 days supply | Qty: 90 | Fill #0

## 2015-09-09 MED FILL — DILANTIN 100 MG CAPSULE: 100 | 30 days supply | Qty: 180 | Fill #9

## 2015-10-07 MED FILL — DILANTIN 100 MG CAPSULE: 100 | 30 days supply | Qty: 180 | Fill #10

## 2015-11-03 ENCOUNTER — Encounter: Payer: Self-pay | Admitting: Family Medicine

## 2015-11-03 MED ORDER — ROSUVASTATIN CALCIUM 20 MG PO TABS: 20.0000 mg | ORAL_TABLET | Freq: Every day | ORAL | 0 refills | Status: DC

## 2015-11-03 MED FILL — ROSUVASTATIN CALCIUM 20 MG: 20 | 90 days supply | Qty: 90 | Fill #0

## 2015-11-04 MED FILL — LEVOTHYROXINE 25 MCG TABLET: 25 | 90 days supply | Qty: 90 | Fill #1

## 2015-11-04 MED FILL — LISINOPRIL 10 MG TABLET: 10 | 90 days supply | Qty: 90 | Fill #3

## 2015-11-04 MED FILL — DILANTIN 100 MG CAPSULE: 100 | 30 days supply | Qty: 180 | Fill #11

## 2015-11-04 MED FILL — HYDROCHLOROTHIAZIDE 25 MG T: 25 | 90 days supply | Qty: 90 | Fill #3

## 2015-12-02 ENCOUNTER — Ambulatory Visit: Payer: 59 | Admitting: Nurse Practitioner

## 2015-12-06 ENCOUNTER — Telehealth: Payer: Self-pay | Admitting: Neurology

## 2015-12-06 MED ORDER — DILANTIN 100 MG PO CAPS: 300.0000 mg | ORAL_CAPSULE | Freq: Two times a day (BID) | ORAL | 0 refills | Status: DC

## 2015-12-06 MED FILL — DILANTIN 100 MG CAPSULE: 100 | 30 days supply | Qty: 180 | Fill #0

## 2015-12-06 NOTE — Telephone Encounter (Signed)
30 day refill e-scribed to pharmacy as requested. Pt has OV r/s to 12/27/15 and may require additional refills at that time.

## 2015-12-06 NOTE — Telephone Encounter (Signed)
Patient requesting refill of DILANTIN 100 MG ER capsule Pharmacy: pick up

## 2015-12-06 NOTE — Telephone Encounter (Signed)
Pt called to advise RX should have went to [facility]. She said she has not used [facility] in 5 yrs, pls do not send any refills to them. She works for [employer] and must get refills thru [employer] [facility]. Pt advised she needs this med sent to pharmacy today

## 2015-12-06 NOTE — Telephone Encounter (Signed)
Rx resent to [facility]. Removed [facility] from list of preferred pharmacies.

## 2015-12-06 NOTE — Addendum Note (Signed)
Addended by: Donnelly AngelicaHOGAN, JENNIFER L on: 12/06/2015 02:53 PM   Modules accepted: Orders

## 2015-12-14 ENCOUNTER — Ambulatory Visit: Payer: 59 | Admitting: Nurse Practitioner

## 2015-12-21 ENCOUNTER — Ambulatory Visit (INDEPENDENT_AMBULATORY_CARE_PROVIDER_SITE_OTHER): Payer: 59 | Admitting: Family Medicine

## 2015-12-21 ENCOUNTER — Encounter: Payer: Self-pay | Admitting: Family Medicine

## 2015-12-21 VITALS — BP 122/82 | HR 92 | Temp 97.4°F | Ht 71.0 in | Wt 282.0 lb

## 2015-12-21 DIAGNOSIS — G40909 Epilepsy, unspecified, not intractable, without status epilepticus: Secondary | ICD-10-CM | POA: Diagnosis not present

## 2015-12-21 DIAGNOSIS — I10 Essential (primary) hypertension: Secondary | ICD-10-CM | POA: Diagnosis not present

## 2015-12-21 DIAGNOSIS — E559 Vitamin D deficiency, unspecified: Secondary | ICD-10-CM | POA: Diagnosis not present

## 2015-12-21 DIAGNOSIS — E7800 Pure hypercholesterolemia, unspecified: Secondary | ICD-10-CM

## 2015-12-21 NOTE — Patient Instructions (Addendum)
Continue current medications. Continue good therapeutic lifestyle changes which include good diet and exercise. Fall precautions discussed with patient. If an FOBT was given today- please return it to our front desk. If you are over 57 years old - you may need Prevnar 13 or the adult Pneumonia vaccine.  **Flu shots are available--- please call and schedule a FLU-CLINIC appointment**  After your visit with us today you will receive a survey in the mail or online from [employer] regarding your care with us. Please take a moment to fill this out. Your feedback is very important to us as you can help us better understand your patient needs as well as improve your experience and satisfaction. WE CARE ABOUT YOU!!!   Continue to follow-up with neurology Continue with her aggressive weight loss plan through diet and exercise and drinking more water and drinking less drinks and eating smaller portions

## 2015-12-21 NOTE — Progress Notes (Signed)
Subjective:    Patient ID: Maria Rivers, female    DOB: 1960/12/26, 54 y.o.   MRN: [id number]  HPI Pt here for follow up and management of chronic medical problems which includes hyperlipidemia and hypertension. She is taking medications regularly.The patient is doing well overall and has no complaints as usual. She did have her flu shot at work. She will get lab work today. She has a history of seizure disorder and does see the neurologist once or twice yearly because of this. She does take Dilantin. She is currently taking a statin drug and we need to clarify which one she is taking because 2 of them are listed in that includes pravastatin and Crestor. She will get lab work today and a flu shot as I said was at work. The patient continues to work at the [employer]. She denies any chest pain or shortness of breath. She denies any trouble with heartburn indigestion nausea vomiting diarrhea or blood in the stool. She is passing her water without problems. She's been attending [employer] drinking more water and eating smaller portions of food has lost 18 pounds and were really proud of her for this. She still in the morbid obese category.    Patient Active Problem List   Diagnosis Date Noted  . Metabolic syndrome 33/54/5625  . Hyperlipidemia 11/28/2012  . Hypothyroidism 11/28/2012  . Hypertension 11/28/2012  . 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 Encounter for long-term (current) use of other medications 11/14/2012   Outpatient Encounter Prescriptions as of 12/21/2015  Medication Sig  . ALPRAZolam (XANAX) 0.25 MG tablet Take one half to one tablet at bedtime as needed  . cholecalciferol (VITAMIN D) 1000 UNITS tablet Take 2,000 Units by mouth daily.   Marland Kitchen DILANTIN 100 MG ER capsule Take 3 capsules (300 mg total) by mouth 2 (two) times daily.  . hydrochlorothiazide (HYDRODIURIL) 25 MG tablet Take 1 tablet (25 mg total) by mouth daily.  Marland Kitchen ibuprofen (ADVIL,MOTRIN) 200 MG tablet  Take 200 mg by mouth as needed. For pain  . levothyroxine (SYNTHROID, LEVOTHROID) 25 MCG tablet Take 1 tablet (25 mcg total) by mouth daily.  Marland Kitchen lisinopril (PRINIVIL,ZESTRIL) 10 MG tablet Take 1 tablet (10 mg total) by mouth daily.  Marland Kitchen loratadine (CLARITIN) 10 MG tablet Take 10 mg by mouth daily.  . pravastatin (PRAVACHOL) 40 MG tablet Take 2 tablets (80 mg total) by mouth daily.  . rosuvastatin (CRESTOR) 20 MG tablet Take 1 tablet (20 mg total) by mouth daily.   No facility-administered encounter medications on file as of 12/21/2015.       Review of Systems  Constitutional: Negative.   HENT: Negative.   Eyes: Negative.   Respiratory: Negative.   Cardiovascular: Negative.   Gastrointestinal: Negative.   Endocrine: Negative.   Genitourinary: Negative.   Musculoskeletal: Negative.   Skin: Negative.   Allergic/Immunologic: Negative.   Neurological: Negative.   Hematological: Negative.   Psychiatric/Behavioral: Negative.        Objective:   Physical Exam  Constitutional: She is oriented to person, place, and time. She appears well-developed and well-nourished.  Patient is pleasant and alert has lost 18 pounds since the last visit.  HENT:  Head: Normocephalic and atraumatic.  Right Ear: External ear normal.  Left Ear: External ear normal.  Nose: Nose normal.  Mouth/Throat: Oropharynx is clear and moist. No oropharyngeal exudate.  Eyes: Conjunctivae and EOM are normal. Pupils are equal, round, and reactive to light. Right eye exhibits no discharge.  Left eye exhibits no discharge. No scleral icterus.  Neck: Normal range of motion. Neck supple. No thyromegaly present.  Bruits or thyromegaly are not present  Cardiovascular: Normal rate, regular rhythm, normal heart sounds and intact distal pulses.   No murmur heard. Pulmonary/Chest: Effort normal and breath sounds normal. No respiratory distress. She has no wheezes. She has no rales.  Abdominal: Soft. Bowel sounds are normal. She  exhibits no mass. There is no tenderness. There is no rebound and no guarding.  Obesity without masses tenderness or organ enlargement or bruits  Musculoskeletal: Normal range of motion. She exhibits no edema.  Lymphadenopathy:    She has no cervical adenopathy.  Neurological: She is alert and oriented to person, place, and time. She has normal reflexes. No cranial nerve deficit.  Skin: Skin is warm and dry. No rash noted.  Psychiatric: She has a normal mood and affect. Her behavior is normal. Judgment and thought content normal.  Nursing note and vitals reviewed.  BP 122/82 (BP Location: Left Arm)   Pulse 92   Temp 97.4 F (36.3 C) (Oral)   Ht '5\' 11"'  (1.803 m)   Wt 282 lb (127.9 kg)   LMP 03/16/2011   BMI 39.33 kg/m         Assessment & Plan:  1. Pure hypercholesterolemia -Continue aggressive therapeutic lifestyle changes including statin drug diet and exercise pending results of lab work - CBC with Differential/Platelet - Lipid panel  2. Essential hypertension -The blood pressure is good today and she will continue with current treatment as well as sodium restriction and weight loss - BMP8+EGFR - CBC with Differential/Platelet - Hepatic function panel  3. Vitamin D deficiency -Continue current treatment pending results of lab work - CBC with Differential/Platelet - VITAMIN D 25 Hydroxy (Vit-D Deficiency, Fractures)  4. Seizure disorder [facility]) Continue regular follow-up with neurology and continue current treatment pending results of Dilantin level CBC with Differential/Platelet - Phenytoin level, total  5. Morbid obesity due to excess calories ([place]) -Continue with weight loss and proud of what she is lost recently which is 18 pounds.  Patient Instructions  Continue current medications. Continue good therapeutic lifestyle changes which include good diet and exercise. Fall precautions discussed with patient. If an FOBT was given today- please return it to our front  desk. If you are over 15 years old - you may need Prevnar 45 or the adult Pneumonia vaccine.  **Flu shots are available--- please call and schedule a FLU-CLINIC appointment**  After your visit with [place] today you will receive a survey in the mail or online from [employer] regarding your care with [place]. Please take a moment to fill this out. Your feedback is very important to [place] as you can help [place] better understand your patient needs as well as improve your experience and satisfaction. WE CARE ABOUT YOU!!!   Continue to follow-up with neurology Continue with her aggressive weight loss plan through diet and exercise and drinking more water and drinking less drinks and eating smaller portions   Arrie Senate MD

## 2015-12-22 LAB — CBC WITH DIFFERENTIAL/PLATELET
BASOS ABS: 0 10*3/uL (ref 0.0–0.2)
Basos: 0 %
EOS (ABSOLUTE): 0.2 10*3/uL (ref 0.0–0.4)
Eos: 3 %
HEMOGLOBIN: 13.3 g/dL (ref 11.1–15.9)
Hematocrit: 39.9 % (ref 34.0–46.6)
IMMATURE GRANS (ABS): 0 10*3/uL (ref 0.0–0.1)
Immature Granulocytes: 0 %
LYMPHS: 28 %
Lymphocytes Absolute: 2.5 10*3/uL (ref 0.7–3.1)
MCH: 29.7 pg (ref 26.6–33.0)
MCHC: 33.3 g/dL (ref 31.5–35.7)
MCV: 89 fL (ref 79–97)
MONOCYTES: 6 %
Monocytes Absolute: 0.6 10*3/uL (ref 0.1–0.9)
NEUTROS ABS: 5.5 10*3/uL (ref 1.4–7.0)
Neutrophils: 63 %
Platelets: 237 10*3/uL (ref 150–379)
RBC: 4.48 x10E6/uL (ref 3.77–5.28)
RDW: 14.6 % (ref 12.3–15.4)
WBC: 8.9 10*3/uL (ref 3.4–10.8)

## 2015-12-22 LAB — LIPID PANEL
CHOL/HDL RATIO: 3.2 ratio (ref 0.0–4.4)
Cholesterol, Total: 161 mg/dL (ref 100–199)
HDL: 51 mg/dL (ref >39)
LDL CALC: 87 mg/dL (ref 0–99)
Triglycerides: 117 mg/dL (ref 0–149)
VLDL CHOLESTEROL CAL: 23 mg/dL (ref 5–40)

## 2015-12-22 LAB — BMP8+EGFR
BUN/Creatinine Ratio: 33 — ABNORMAL HIGH (ref 9–23)
BUN: 28 mg/dL — AB (ref 6–24)
CALCIUM: 10 mg/dL (ref 8.7–10.2)
CHLORIDE: 96 mmol/L (ref 96–106)
CO2: 25 mmol/L (ref 18–29)
Creatinine, Ser: 0.84 mg/dL (ref 0.57–1.00)
GFR calc non Af Amer: 77 mL/min/{1.73_m2} (ref >59)
GFR, EST AFRICAN AMERICAN: 89 mL/min/{1.73_m2} (ref >59)
Glucose: 84 mg/dL (ref 65–99)
Potassium: 3.9 mmol/L (ref 3.5–5.2)
Sodium: 137 mmol/L (ref 134–144)

## 2015-12-22 LAB — HEPATIC FUNCTION PANEL
ALBUMIN: 4.4 g/dL (ref 3.5–5.5)
ALK PHOS: 108 IU/L (ref 39–117)
ALT: 25 IU/L (ref 0–32)
AST: 23 IU/L (ref 0–40)
BILIRUBIN TOTAL: 0.3 mg/dL (ref 0.0–1.2)
BILIRUBIN, DIRECT: 0.14 mg/dL (ref 0.00–0.40)
TOTAL PROTEIN: 7.2 g/dL (ref 6.0–8.5)

## 2015-12-22 LAB — VITAMIN D 25 HYDROXY (VIT D DEFICIENCY, FRACTURES): Vit D, 25-Hydroxy: 56.2 ng/mL (ref 30.0–100.0)

## 2015-12-22 LAB — PHENYTOIN LEVEL, TOTAL: PHENYTOIN (DILANTIN), SERUM: 16.6 ug/mL (ref 10.0–20.0)

## 2015-12-27 ENCOUNTER — Encounter: Payer: Self-pay | Admitting: Nurse Practitioner

## 2015-12-27 ENCOUNTER — Ambulatory Visit (INDEPENDENT_AMBULATORY_CARE_PROVIDER_SITE_OTHER): Payer: 59 | Admitting: Nurse Practitioner

## 2015-12-27 VITALS — BP 109/69 | HR 86 | Ht 71.0 in | Wt 282.8 lb

## 2015-12-27 DIAGNOSIS — G40309 Generalized idiopathic epilepsy and epileptic syndromes, not intractable, without status epilepticus: Secondary | ICD-10-CM | POA: Diagnosis not present

## 2015-12-27 MED ORDER — DILANTIN 100 MG PO CAPS: 300.0000 mg | ORAL_CAPSULE | Freq: Two times a day (BID) | ORAL | 11 refills | Status: DC

## 2015-12-27 NOTE — Progress Notes (Signed)
I have read the note, and I agree with the clinical assessment and plan.  Jarrius Huaracha KEITH   

## 2015-12-27 NOTE — Patient Instructions (Addendum)
Will renew meds monthly (BRAND) Dilantin Dilantin level 16.6 on 12/21/15  CBC and Liver function WNL Follow-up yearly and when necessary

## 2015-12-27 NOTE — Progress Notes (Signed)
[facility]  PATIENT: Maria Rivers DOB: [birth year]   REASON FOR VISIT: Follow-up for generalized epilepsy HISTORY FROM: History from patient    HISTORY OF PRESENT ILLNESS:Ms Maria Rivers, 55 year old returns for yearly follow up.  She has a history of seizures. Last seizure activity was a brief staring episode in July 2012, and the Dilantin level was found to be low in the 6.0 range. The patient was increased on the Dilantin. She had repeat level done 10/08/2013, in Dr. Kathi Rivers's office, level was 25.6. Dose was reduced to 600 mg of Dilantin daily (BRAND). The patient has done well on this dose, and denies any side effects such as drowsiness or gait instability. CBC and liver function done in Dr. Kathi Rivers's office 12/21/15  were within normal limits. Dilantin level 16.6.Patient returns for reevaluationand refills    REVIEW OF SYSTEMS: Full 14 system review of systems performed and notable only for those listed, all others are neg:  Constitutional: neg  Cardiovascular: neg Ear/Nose/Throat: neg  Skin: neg Eyes: neg Respiratory: neg Gastroitestinal: neg  Hematology/Lymphatic: neg  Endocrine: neg Musculoskeletal:neg Allergy/Immunology: neg Neurological: neg Psychiatric: neg Sleep : neg   ALLERGIES: Allergies  Allergen Reactions  . Mushroom Extract Complex Swelling  . Penicillins     UNKNOWN REACTION  . Shellfish Allergy Swelling    HOME MEDICATIONS: Outpatient Medications Prior to Visit  Medication Sig Dispense Refill  . ALPRAZolam (XANAX) 0.25 MG tablet Take one half to one tablet at bedtime as needed 30 tablet 0  . cholecalciferol (VITAMIN D) 1000 UNITS tablet Take 2,000 Units by mouth daily.     Marland Kitchen. DILANTIN 100 MG ER capsule Take 3 capsules (300 mg total) by mouth 2 (two) times daily. 180 capsule 0  . hydrochlorothiazide (HYDRODIURIL) 25 MG tablet Take 1 tablet (25 mg total) by mouth daily. 90 tablet 3  . ibuprofen (ADVIL,MOTRIN) 200 MG tablet Take 200 mg by  mouth as needed. For pain    . levothyroxine (SYNTHROID, LEVOTHROID) 25 MCG tablet Take 1 tablet (25 mcg total) by mouth daily. 90 tablet 3  . lisinopril (PRINIVIL,ZESTRIL) 10 MG tablet Take 1 tablet (10 mg total) by mouth daily. 90 tablet 3  . loratadine (CLARITIN) 10 MG tablet Take 10 mg by mouth daily.    . Multiple Vitamin (MULTIVITAMIN WITH MINERALS) TABS tablet Take 1 tablet by mouth daily.    . rosuvastatin (CRESTOR) 20 MG tablet Take 1 tablet (20 mg total) by mouth daily. 90 tablet 0  . pravastatin (PRAVACHOL) 40 MG tablet Take 2 tablets (80 mg total) by mouth daily. (Patient not taking: Reported on 12/27/2015) 180 tablet 3   No facility-administered medications prior to visit.     PAST MEDICAL HISTORY: Past Medical History:  Diagnosis Date  . Hyperlipidemia   . Hypertension   . Seizures (HCC)     PAST SURGICAL HISTORY: History reviewed. No pertinent surgical history.  FAMILY HISTORY: Family History  Problem Relation Age of Onset  . Diabetes Other     SOCIAL HISTORY: Social History   Social History  . Marital status: Married    Spouse name: N/A  . Number of children: N/A  . Years of education: N/A   Occupational History  . Not on file.   Social History Main Topics  . Smoking status: Never Smoker  . Smokeless tobacco: Never Used  . Alcohol use No  . Drug use: No  . Sexual activity: Yes    Birth control/ protection: None  Other Topics Concern  . Not on file   Social History Narrative  . No narrative on file     PHYSICAL EXAM  Vitals:   12/27/15 1543  BP: 109/69  Pulse: 86  Weight: 282 lb 12.8 oz (128.3 kg)  Height: 5\' 11"  (1.803 m)   Body mass index is 39.44 kg/m. Generalized: Well developed, morbidly obese female in no acute distress   Neurological examination   Mentation: Alert oriented to time, place, history taking. Follows all commands speech and language fluent  Cranial nerve II-XII: Pupils were equal round reactive to light  extraocular movements were full, visual field were full on confrontational test. Facial sensation and strength were normal. hearing was intact to finger rubbing bilaterally. Uvula tongue midline. head turning and shoulder shrug and were normal and symmetric.Tongue protrusion into cheek strength was normal. Motor: normal bulk and tone, full strength in the BUE, BLE, fine finger movements normal, no pronator drift. No focal weakness Coordination: finger-nose-finger, no dysmetria.  Reflexes: Depressed and symmetric upper and lower Gait and Station: Rising up from seated position without assistance, normal stance, moderate stride, good arm swing, smooth turning, able to perform tiptoe, and heel walking without difficulty. Tandem gait steady   DIAGNOSTIC DATA (LABS, IMAGING, TESTING) - I reviewed patient records, labs, notes, testing and imaging myself where available.  Lab Results  Component Value Date   WBC 8.9 12/21/2015   HGB 13.5 07/10/2014   HCT 39.9 12/21/2015   MCV 89 12/21/2015   PLT 237 12/21/2015      Component Value Date/Time   NA 137 12/21/2015 1659   K 3.9 12/21/2015 1659   CL 96 12/21/2015 1659   CO2 25 12/21/2015 1659   GLUCOSE 84 12/21/2015 1659   BUN 28 (H) 12/21/2015 1659   CREATININE 0.84 12/21/2015 1659   CALCIUM 10.0 12/21/2015 1659   PROT 7.2 12/21/2015 1659   ALBUMIN 4.4 12/21/2015 1659   AST 23 12/21/2015 1659   ALT 25 12/21/2015 1659   ALKPHOS 108 12/21/2015 1659   BILITOT 0.3 12/21/2015 1659   GFRNONAA 77 12/21/2015 1659   GFRAA 89 12/21/2015 1659   Lab Results  Component Value Date   CHOL 161 12/21/2015   HDL 51 12/21/2015   LDLCALC 87 12/21/2015   TRIG 117 12/21/2015   CHOLHDL 3.2 12/21/2015    Lab Results  Component Value Date   TSH 3.580 08/12/2015      ASSESSMENT AND PLAN  55 y.o. year old female  has a past medical history of Hyperlipidemia; Hypertension; and Seizures (HCC). here to follow-up.Last seizure activity 2012  PLAN: Will  renew meds monthly (BRAND) Dilantin Dilantin level 16.6 on 12/21/15  CBC and Liver function WNL Follow-up yearly and when necessary Call for any seizure activity Maria RiggsNancy Rivers Maria Rivers, [facility] LLCGNP, [facility] HospitalBC, APRN  [facility] [address], [address] [place], [place] [address] [phone]

## 2016-01-04 MED FILL — DILANTIN 100 MG CAPSULE: 100 | 30 days supply | Qty: 180 | Fill #0

## 2016-02-07 ENCOUNTER — Other Ambulatory Visit: Payer: Self-pay | Admitting: Family Medicine

## 2016-02-07 MED FILL — LISINOPRIL 10 MG TABLET: 10 | 90 days supply | Qty: 90 | Fill #0

## 2016-02-07 MED FILL — DILANTIN 100 MG CAPSULE: 100 | 30 days supply | Qty: 180 | Fill #1

## 2016-02-07 MED FILL — HYDROCHLOROTHIAZIDE 25 MG T: 25 | 90 days supply | Qty: 90 | Fill #0

## 2016-02-07 MED FILL — LEVOTHYROXINE 25 MCG TABLET: 25 | 90 days supply | Qty: 90 | Fill #2

## 2016-02-08 ENCOUNTER — Other Ambulatory Visit: Payer: Self-pay | Admitting: *Deleted

## 2016-02-08 MED FILL — ROSUVASTATIN CALCIUM 20 MG: 20 | 90 days supply | Qty: 90 | Fill #0

## 2016-03-03 MED FILL — DILANTIN 100 MG CAPSULE: 100 | 30 days supply | Qty: 180 | Fill #2

## 2016-04-06 MED FILL — DILANTIN 100 MG CAPSULE: 100 | 30 days supply | Qty: 180 | Fill #3

## 2016-04-24 ENCOUNTER — Ambulatory Visit (INDEPENDENT_AMBULATORY_CARE_PROVIDER_SITE_OTHER): Payer: 59 | Admitting: Family Medicine

## 2016-04-24 ENCOUNTER — Encounter: Payer: Self-pay | Admitting: Family Medicine

## 2016-04-24 VITALS — BP 125/82 | HR 90 | Temp 97.0°F | Ht 71.0 in | Wt 280.0 lb

## 2016-04-24 DIAGNOSIS — E7800 Pure hypercholesterolemia, unspecified: Secondary | ICD-10-CM

## 2016-04-24 DIAGNOSIS — G40909 Epilepsy, unspecified, not intractable, without status epilepticus: Secondary | ICD-10-CM

## 2016-04-24 DIAGNOSIS — G40309 Generalized idiopathic epilepsy and epileptic syndromes, not intractable, without status epilepticus: Secondary | ICD-10-CM | POA: Diagnosis not present

## 2016-04-24 DIAGNOSIS — I10 Essential (primary) hypertension: Secondary | ICD-10-CM | POA: Diagnosis not present

## 2016-04-24 DIAGNOSIS — E559 Vitamin D deficiency, unspecified: Secondary | ICD-10-CM | POA: Diagnosis not present

## 2016-04-24 DIAGNOSIS — E034 Atrophy of thyroid (acquired): Secondary | ICD-10-CM

## 2016-04-24 MED ORDER — ROSUVASTATIN CALCIUM 20 MG PO TABS: 20.0000 mg | ORAL_TABLET | Freq: Every day | ORAL | 3 refills | Status: DC

## 2016-04-24 MED ORDER — HYDROCHLOROTHIAZIDE 25 MG PO TABS: 25.0000 mg | ORAL_TABLET | Freq: Every day | ORAL | 3 refills | Status: DC

## 2016-04-24 MED ORDER — LISINOPRIL 10 MG PO TABS: 10.0000 mg | ORAL_TABLET | Freq: Every day | ORAL | 3 refills | Status: DC

## 2016-04-24 MED ORDER — LEVOTHYROXINE SODIUM 25 MCG PO TABS: 25.0000 ug | ORAL_TABLET | Freq: Every day | ORAL | 3 refills | Status: DC

## 2016-04-24 MED FILL — LEVOTHYROXINE 25 MCG TABLET: 25 | 90 days supply | Qty: 90 | Fill #0

## 2016-04-24 MED FILL — LISINOPRIL 10 MG TABLET: 10 | 90 days supply | Qty: 90 | Fill #0

## 2016-04-24 MED FILL — ROSUVASTATIN CALCIUM 20 MG: 20 | 90 days supply | Qty: 90 | Fill #0

## 2016-04-24 NOTE — Patient Instructions (Addendum)
Continue current medications. Continue good therapeutic lifestyle changes which include good diet and exercise. Fall precautions discussed with patient. If an FOBT was given today- please return it to our front desk. If you are over 58 years old - you may need Prevnar 13 or the adult Pneumonia vaccine.  **Flu shots are available--- please call and schedule a FLU-CLINIC appointment**  After your visit with [place] today you will receive a survey in the mail or online from [employer] regarding your care with [place]. Please take a moment to fill this out. Your feedback is very important to [place] as you can help [place] better understand your patient needs as well as improve your experience and satisfaction. WE CARE ABOUT YOU!!!   The patient is being applauded today for continued efforts at weight loss. She should continue with her current medicine as specimen with her Dilantin brand name and continue to follow-up with the neurologist as requested by him. She should continue to drink plenty of fluids and stay well hydrated. Patient should be encouraged to get her pelvic exam mammogram DEXA scan chest x-ray and FOBT. She should continue all efforts at weight loss We will call with lab work results as soon as they become available

## 2016-04-24 NOTE — Progress Notes (Signed)
Subjective:    Patient ID: Maria Rivers, female    DOB: October 19, 1960, 56 y.o.   MRN: [id number]  HPI Pt here for follow up and management of chronic medical problems which includes hyperlipidemia, hypertension and seizure disorder. She is taking medication regularly.The patient is doing well overall and is to be applauded for her continued weight gain. She has no specific complaints. She sees the neurologist yearly and continues to take her Dilantin. The patient today denies any chest pain or shortness of breath. She is very positive about her efforts at weight loss even though it is slow but still seems to be improving. She also has another reason now to lose weight because she has to fix food for her husband who has diabetes. She is now encouraging him to lose weight loss and get his diabetes under better control. She denies any trouble with her stomach including nausea vomiting diarrhea blood in the stool or black tarry bowel movements. Her bowel habits are stable and regular. She denies any trouble passing her water. When she comes back with her husband in a few weeks to get a visit with the clinical pharmacists, she will give [place] a urine specimen at that time.    Patient Active Problem List   Diagnosis Date Noted  . Metabolic syndrome 51/02/5850  . Hyperlipidemia 11/28/2012  . Hypothyroidism 11/28/2012  . Hypertension 11/28/2012  . 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 Encounter for long-term (current) use of other medications 11/14/2012   Outpatient Encounter Prescriptions as of 04/24/2016  Medication Sig  . ALPRAZolam (XANAX) 0.25 MG tablet Take one half to one tablet at bedtime as needed  . cholecalciferol (VITAMIN D) 1000 UNITS tablet Take 2,000 Units by mouth daily.   Marland Kitchen DILANTIN 100 MG ER capsule Take 3 capsules (300 mg total) by mouth 2 (two) times daily.  . hydrochlorothiazide (HYDRODIURIL) 25 MG tablet Take 1 tablet (25 mg total) by mouth daily.  Marland Kitchen ibuprofen  (ADVIL,MOTRIN) 200 MG tablet Take 200 mg by mouth as needed. For pain  . levothyroxine (SYNTHROID, LEVOTHROID) 25 MCG tablet Take 1 tablet (25 mcg total) by mouth daily.  Marland Kitchen lisinopril (PRINIVIL,ZESTRIL) 10 MG tablet Take 1 tablet (10 mg total) by mouth daily.  Marland Kitchen loratadine (CLARITIN) 10 MG tablet Take 10 mg by mouth daily.  . Multiple Vitamin (MULTIVITAMIN WITH MINERALS) TABS tablet Take 1 tablet by mouth daily.  . rosuvastatin (CRESTOR) 20 MG tablet TAKE 1 TABLET BY MOUTH DAILY.   No facility-administered encounter medications on file as of 04/24/2016.       Review of Systems  Constitutional: Negative.   HENT: Negative.   Eyes: Negative.   Respiratory: Negative.   Cardiovascular: Negative.   Gastrointestinal: Negative.   Endocrine: Negative.   Genitourinary: Negative.   Musculoskeletal: Negative.   Skin: Negative.   Allergic/Immunologic: Negative.   Neurological: Negative.   Hematological: Negative.   Psychiatric/Behavioral: Negative.        Objective:   Physical Exam  Constitutional: She is oriented to person, place, and time. She appears well-developed and well-nourished.  Pleasant and alert  HENT:  Head: Normocephalic and atraumatic.  Right Ear: External ear normal.  Left Ear: External ear normal.  Mouth/Throat: Oropharynx is clear and moist.  Nasal congestion left greater than right  Eyes: Conjunctivae and EOM are normal. Pupils are equal, round, and reactive to light. Right eye exhibits no discharge. Left eye exhibits no discharge. No scleral icterus.  Neck: Normal range of motion. Neck  supple. No thyromegaly present.  Cardiovascular: Normal rate, regular rhythm, normal heart sounds and intact distal pulses.   No murmur heard. Pedal pulses were weak The heart is regular at 84/m  Pulmonary/Chest: Effort normal and breath sounds normal. No respiratory distress. She has no wheezes. She has no rales.  Abdominal: Soft. Bowel sounds are normal. She exhibits no mass. There  is no tenderness. There is no rebound and no guarding.  Abdominal obesity without masses tenderness or organ enlargement or bruits  Musculoskeletal: Normal range of motion. She exhibits no edema.  Lymphadenopathy:    She has no cervical adenopathy.  Neurological: She is alert and oriented to person, place, and time. She has normal reflexes. No cranial nerve deficit.  Skin: Skin is warm and dry. No rash noted.  Psychiatric: She has a normal mood and affect. Her behavior is normal. Judgment and thought content normal.  Nursing note and vitals reviewed.  BP 125/82 (BP Location: Left Arm)   Pulse 90   Temp 97 F (36.1 C) (Oral)   Ht 5' 11" (1.803 m)   Wt 280 lb (127 kg)   LMP 03/16/2011   BMI 39.05 kg/m         Assessment & Plan:  1. Pure hypercholesterolemia -Continue with current treatment and aggressive therapeutic lifestyle changes pending results of lab work - CBC with Differential/Platelet - Lipid panel  2. Essential hypertension -The blood pressure is good today and she will continue with efforts at weight loss sodium restriction and her current medication - BMP8+EGFR - CBC with Differential/Platelet - Hepatic function panel  3. Seizure disorder ([place]) -There been no seizure episodes. She sees the neurologist or his PA once yearly in August. We monitor the Dilantin levels here. - CBC with Differential/Platelet - Phenytoin level, free and total  4. Vitamin D deficiency -Continue vitamin D replacement pending results of lab work - CBC with Differential/Platelet - VITAMIN D 25 Hydroxy (Vit-D Deficiency, Fractures)  5. Morbid obesity due to excess calories ([place]) -Continue aggressive therapeutic lifestyle changes with diet exercise increase fluid intake and weight loss. - CBC with Differential/Platelet  6. Generalized convulsive epilepsy ([place]) -No seizure episodes. Continue current Dilantin treatment and follow-up yearly with neurology  7. Hypothyroidism -Continue  with current treatment pending results of lab work  Meds ordered this encounter  Medications  . hydrochlorothiazide (HYDRODIURIL) 25 MG tablet    Sig: Take 1 tablet (25 mg total) by mouth daily.    Dispense:  90 tablet    Refill:  3  . rosuvastatin (CRESTOR) 20 MG tablet    Sig: Take 1 tablet (20 mg total) by mouth daily.    Dispense:  90 tablet    Refill:  3  . lisinopril (PRINIVIL,ZESTRIL) 10 MG tablet    Sig: Take 1 tablet (10 mg total) by mouth daily.    Dispense:  90 tablet    Refill:  3  . levothyroxine (SYNTHROID, LEVOTHROID) 25 MCG tablet    Sig: Take 1 tablet (25 mcg total) by mouth daily.    Dispense:  90 tablet    Refill:  3   Patient Instructions  Continue current medications. Continue good therapeutic lifestyle changes which include good diet and exercise. Fall precautions discussed with patient. If an FOBT was given today- please return it to our front desk. If you are over 55 years old - you may need Prevnar 37 or the adult Pneumonia vaccine.  **Flu shots are available--- please call and schedule a FLU-CLINIC appointment**  After your visit with [place] today you will receive a survey in the mail or online from [employer] regarding your care with [place]. Please take a moment to fill this out. Your feedback is very important to [place] as you can help [place] better understand your patient needs as well as improve your experience and satisfaction. WE CARE ABOUT YOU!!!   The patient is being applauded today for continued efforts at weight loss. She should continue with her current medicine as specimen with her Dilantin brand name and continue to follow-up with the neurologist as requested by him. She should continue to drink plenty of fluids and stay well hydrated. Patient should be encouraged to get her pelvic exam mammogram DEXA scan chest x-ray and FOBT. She should continue all efforts at weight loss We will call with lab work results as soon as they become available  Arrie Senate MD

## 2016-04-25 LAB — LIPID PANEL
CHOLESTEROL TOTAL: 155 mg/dL (ref 100–199)
Chol/HDL Ratio: 3.4 ratio (ref 0.0–4.4)
HDL: 45 mg/dL (ref >39)
LDL Calculated: 82 mg/dL (ref 0–99)
TRIGLYCERIDES: 139 mg/dL (ref 0–149)
VLDL Cholesterol Cal: 28 mg/dL (ref 5–40)

## 2016-04-25 LAB — CBC WITH DIFFERENTIAL/PLATELET
BASOS ABS: 0 10*3/uL (ref 0.0–0.2)
Basos: 0 %
EOS (ABSOLUTE): 0.2 10*3/uL (ref 0.0–0.4)
Eos: 3 %
Hematocrit: 40 % (ref 34.0–46.6)
Hemoglobin: 13.5 g/dL (ref 11.1–15.9)
Immature Grans (Abs): 0 10*3/uL (ref 0.0–0.1)
Immature Granulocytes: 0 %
LYMPHS ABS: 2.4 10*3/uL (ref 0.7–3.1)
Lymphs: 30 %
MCH: 29.7 pg (ref 26.6–33.0)
MCHC: 33.8 g/dL (ref 31.5–35.7)
MCV: 88 fL (ref 79–97)
MONOS ABS: 0.4 10*3/uL (ref 0.1–0.9)
Monocytes: 6 %
Neutrophils Absolute: 5 10*3/uL (ref 1.4–7.0)
Neutrophils: 61 %
Platelets: 219 10*3/uL (ref 150–379)
RBC: 4.55 x10E6/uL (ref 3.77–5.28)
RDW: 14.3 % (ref 12.3–15.4)
WBC: 8 10*3/uL (ref 3.4–10.8)

## 2016-04-25 LAB — BMP8+EGFR
BUN/Creatinine Ratio: 38 — ABNORMAL HIGH (ref 9–23)
BUN: 30 mg/dL — AB (ref 6–24)
CALCIUM: 9.7 mg/dL (ref 8.7–10.2)
CHLORIDE: 95 mmol/L — AB (ref 96–106)
CO2: 29 mmol/L (ref 18–29)
Creatinine, Ser: 0.79 mg/dL (ref 0.57–1.00)
GFR calc non Af Amer: 83 mL/min/{1.73_m2} (ref >59)
GFR, EST AFRICAN AMERICAN: 96 mL/min/{1.73_m2} (ref >59)
GLUCOSE: 89 mg/dL (ref 65–99)
POTASSIUM: 4 mmol/L (ref 3.5–5.2)
Sodium: 137 mmol/L (ref 134–144)

## 2016-04-25 LAB — HEPATIC FUNCTION PANEL
ALBUMIN: 4.1 g/dL (ref 3.5–5.5)
ALT: 23 IU/L (ref 0–32)
AST: 21 IU/L (ref 0–40)
Alkaline Phosphatase: 95 IU/L (ref 39–117)
Bilirubin Total: 0.3 mg/dL (ref 0.0–1.2)
Bilirubin, Direct: 0.14 mg/dL (ref 0.00–0.40)
Total Protein: 7.4 g/dL (ref 6.0–8.5)

## 2016-04-25 LAB — PHENYTOIN LEVEL, FREE AND TOTAL
Phenytoin, Free: 1.1 ug/mL (ref 1.0–2.0)
Phenytoin, Serum: 18.9 ug/mL (ref 10.0–20.0)

## 2016-04-25 LAB — VITAMIN D 25 HYDROXY (VIT D DEFICIENCY, FRACTURES): VIT D 25 HYDROXY: 48.2 ng/mL (ref 30.0–100.0)

## 2016-05-03 MED FILL — HYDROCHLOROTHIAZIDE 25 MG T: 25 | 90 days supply | Qty: 90 | Fill #0

## 2016-05-03 MED FILL — DILANTIN 100 MG CAPSULE: 100 | 30 days supply | Qty: 180 | Fill #4

## 2016-05-10 ENCOUNTER — Encounter: Payer: Self-pay | Admitting: Family Medicine

## 2016-05-23 ENCOUNTER — Ambulatory Visit (INDEPENDENT_AMBULATORY_CARE_PROVIDER_SITE_OTHER): Payer: 59 | Admitting: Nurse Practitioner

## 2016-05-23 ENCOUNTER — Encounter: Payer: Self-pay | Admitting: Nurse Practitioner

## 2016-05-23 ENCOUNTER — Telehealth: Payer: Self-pay | Admitting: Nurse Practitioner

## 2016-05-23 ENCOUNTER — Encounter: Payer: Self-pay | Admitting: *Deleted

## 2016-05-23 VITALS — BP 131/80 | HR 101 | Wt 274.2 lb

## 2016-05-23 DIAGNOSIS — Z5181 Encounter for therapeutic drug level monitoring: Secondary | ICD-10-CM | POA: Diagnosis not present

## 2016-05-23 DIAGNOSIS — G40309 Generalized idiopathic epilepsy and epileptic syndromes, not intractable, without status epilepticus: Secondary | ICD-10-CM | POA: Diagnosis not present

## 2016-05-23 MED ORDER — LEVETIRACETAM 500 MG PO TABS: 500.0000 mg | ORAL_TABLET | Freq: Two times a day (BID) | ORAL | 1 refills | Status: DC

## 2016-05-23 MED FILL — levETIRAcetam 500 MG TABS: 500 | 90 days supply | Qty: 180 | Fill #0

## 2016-05-23 NOTE — Telephone Encounter (Signed)
Dr Moore/[facility] called this morning said he rec'd a call from the patient, she was tearful crying. Said she had 3-4 major seizures in 3 days. He said dilantin levels were normal. I told him I would call the pt to get her seen today. He was thankful.  I called the pt, she was teary eyed, said she had 3 seizures Friday, 2-3 Saturday [facility]. Pt said she was scared, said the dilantin levels were nl in April and I've lost 30 #'s. Pt is RN at [employer], she at work now. An appt was made today with Eber Jonesarolyn @ 1:15. Advised if questions RN would call.

## 2016-05-23 NOTE — Progress Notes (Signed)
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 She has a history of seizure disorder. Most recent Dilantin level at primary care's office on 04/25/2016 was 18.9. She is on brand Dilantin taking 3 capsules twice a day. She denies missing any doses. On Friday of last week she had 3 seizure episodes about an hour apart, one occurred in her sleep , on Saturday she had 2 additional seizure events at home Sunday she had one in church. Husband witnessed some of her events where she was trembling on one side of body and not aware. She did not bite her tongue. There was no loss of bowel or bladder control. She was confused for several hours afterwards. Prior to this her last seizure was July 2012 and that was a brief staring episode. She denies any signs and symptoms of infection. She does say she has lost 30 pounds in the last 3 months. She returns for reevaluation   UPDATE 12/27/15 CMMs Maria Rivers, 56 year old returns for yearly follow up.  She has a history of seizures. Last seizure activity was a brief staring episode in July 2012, and the Dilantin level was found to be low in the 6.0 range. The patient was increased on the Dilantin. She had repeat level done 10/08/2013, in Dr. Kathi Der office, level was 25.6. Dose was reduced to 600 mg of Dilantin daily (BRAND). The patient has done well on this dose, and denies any side effects such as drowsiness or gait instability. CBC and liver function done in Dr. Kathi Der office 12/21/15  were within normal limits. Dilantin level 16.6.Patient returns for reevaluationand refills    REVIEW OF SYSTEMS: Full 14 system review of systems performed and notable only for those listed, all others are  neg:  Constitutional: neg  Cardiovascular: neg Ear/Nose/Throat: neg  Skin: neg Eyes: neg Respiratory: neg Gastroitestinal: neg  Hematology/Lymphatic: neg  Endocrine: neg Musculoskeletal:neg Allergy/Immunology: neg Neurological: Recent seizure activity Psychiatric: neg Sleep : neg   ALLERGIES: Allergies  Allergen Reactions  . Mushroom Extract Complex Swelling  . Penicillins     UNKNOWN REACTION  . Shellfish Allergy Swelling    HOME MEDICATIONS: Outpatient Medications Prior to Visit  Medication Sig Dispense Refill  . ALPRAZolam (XANAX) 0.25 MG tablet Take one half to one tablet at bedtime as needed 30 tablet 0  . cholecalciferol (VITAMIN D) 1000 UNITS tablet Take 2,000 Units by mouth daily.     Marland Kitchen DILANTIN 100 MG ER capsule Take 3 capsules (300 mg total) by mouth 2 (two) times daily. 180 capsule 11  . hydrochlorothiazide (HYDRODIURIL) 25 MG tablet Take 1 tablet (25 mg total) by mouth daily. 90 tablet 3  . ibuprofen (ADVIL,MOTRIN) 200 MG tablet Take 200 mg by mouth as needed. For pain    . levothyroxine (SYNTHROID, LEVOTHROID) 25 MCG tablet Take 1 tablet (25 mcg total) by mouth daily. 90 tablet 3  . lisinopril (PRINIVIL,ZESTRIL) 10 MG tablet Take 1 tablet (10 mg total) by mouth daily. 90 tablet 3  . loratadine (CLARITIN) 10 MG tablet Take 10 mg by mouth daily.    . Multiple Vitamin (MULTIVITAMIN WITH MINERALS) TABS tablet Take 1 tablet by mouth daily.    . rosuvastatin (CRESTOR) 20 MG tablet Take 1 tablet (20 mg total)  by mouth daily. 90 tablet 3   No facility-administered medications prior to visit.     PAST MEDICAL HISTORY: Past Medical History:  Diagnosis Date  . Hyperlipidemia   . Hypertension   . Seizures (HCC)     PAST SURGICAL HISTORY: History reviewed. No pertinent surgical history.  FAMILY HISTORY: Family History  Problem Relation Age of Onset  . Diabetes Other     SOCIAL HISTORY: Social History   Social History  . Marital status: Married     Spouse name: N/A  . Number of children: N/A  . Years of education: N/A   Occupational History  . Not on file.   Social History Main Topics  . Smoking status: Never Smoker  . Smokeless tobacco: Never Used  . Alcohol use No  . Drug use: No  . Sexual activity: Yes    Birth control/ protection: None   Other Topics Concern  . Not on file   Social History Narrative  . No narrative on file     PHYSICAL EXAM  Vitals:   05/23/16 1301  BP: 131/80  Pulse: (!) 101  Weight: 274 lb 3.2 oz (124.4 kg)   Body mass index is 38.24 kg/m. Generalized: Well developed, morbidly obese female in no acute distress   Neurological examination   Mentation: Alert oriented to time, place, history taking. Follows all commands speech and language fluent  Cranial nerve II-XII: Pupils were equal round reactive to light extraocular movements were full, visual field were full on confrontational test. Facial sensation and strength were normal. hearing was intact to finger rubbing bilaterally. Uvula tongue midline. head turning and shoulder shrug and were normal and symmetric.Tongue protrusion into cheek strength was normal. Motor: normal bulk and tone, full strength in the BUE, BLE, fine finger movements normal, no pronator drift. No focal weakness Coordination: finger-nose-finger, no dysmetria.  Reflexes: Depressed and symmetric upper and lower Gait and Station: Rising up from seated position without assistance, normal stance, moderate stride, good arm swing, smooth turning, able to perform tiptoe, and heel walking without difficulty. Tandem gait steady   DIAGNOSTIC DATA (LABS, IMAGING, TESTING) - I reviewed patient records, labs, notes, testing and imaging myself where available.  Lab Results  Component Value Date   WBC 8.0 04/24/2016   HGB 13.5 07/10/2014   HCT 40.0 04/24/2016   MCV 88 04/24/2016   PLT 219 04/24/2016      Component Value Date/Time   NA 137 04/24/2016 1655   K 4.0  04/24/2016 1655   CL 95 (L) 04/24/2016 1655   CO2 29 04/24/2016 1655   GLUCOSE 89 04/24/2016 1655   BUN 30 (H) 04/24/2016 1655   CREATININE 0.79 04/24/2016 1655   CALCIUM 9.7 04/24/2016 1655   PROT 7.4 04/24/2016 1655   ALBUMIN 4.1 04/24/2016 1655   AST 21 04/24/2016 1655   ALT 23 04/24/2016 1655   ALKPHOS 95 04/24/2016 1655   BILITOT 0.3 04/24/2016 1655   GFRNONAA 83 04/24/2016 1655   GFRAA 96 04/24/2016 1655   Lab Results  Component Value Date   CHOL 155 04/24/2016   HDL 45 04/24/2016   LDLCALC 82 04/24/2016   TRIG 139 04/24/2016   CHOLHDL 3.4 04/24/2016    Lab Results  Component Value Date   TSH 3.580 08/12/2015      ASSESSMENT AND PLAN  56 y.o. year old female  has a past medical history of Hyperlipidemia; Hypertension; and Seizures (HCC). here to follow-up.She had a total of 6 seizures over the  weekend. She has been compliant with her medication  PLAN: Discussed with Dr. Anne HahnWillis  Continue Dilantin at current dose  Will check Dilantin level today for toxicity which can also cause seizure activity Add Keppra 500 mg twice daily Call for any further seizure activity If seizure activity continues will obtain MRI of the brain Patient was given a note to stay out of work until Monday, May 14 F/U in 2 months I spent 25 min  in total face to face time with the patient more than 50% of which was spent counseling and coordination of care, reviewing test results reviewing medications and discussing and reviewing the diagnosis of seizure disorder and further treatment. MRI of the brain will be ordered if seizure activity continues.  Nilda RiggsNancy Carolyn Jacayla Nordell, [facility] HospitalGNP, [facility]BC, APRN  [facility] [address], [address] [place], [place] [address] [phone]

## 2016-05-23 NOTE — Patient Instructions (Signed)
Continue Dilantin at current dose  Will check level today  Add Keppra 500 mg twice daily Call for any seizure activity F/U in 2 months

## 2016-05-23 NOTE — Progress Notes (Signed)
I have read the note, and I agree with the clinical assessment and plan.  Maria Rivers   

## 2016-05-24 ENCOUNTER — Telehealth: Payer: Self-pay | Admitting: *Deleted

## 2016-05-24 LAB — PHENYTOIN LEVEL, TOTAL: Phenytoin (Dilantin), Serum: 17.4 ug/mL (ref 10.0–20.0)

## 2016-05-24 NOTE — Telephone Encounter (Signed)
-----   Message from Nilda RiggsNancy Carolyn Martin, NP sent at 05/24/2016  8:27 AM EDT ----- Dilantin level good please call

## 2016-05-24 NOTE — Telephone Encounter (Signed)
Per Enid Skeens Martin, NP , spoke with patient and informed her that her Dilantin level is good. She stated she slept the best last night that she has slept in a long time. She stated she took Carolyn's advice from yesterday. She stated she had not been eating well, but she ate dinner and slept very well. She verbalized understanding of call, and appreciation.

## 2016-05-30 ENCOUNTER — Encounter: Payer: Self-pay | Admitting: Nurse Practitioner

## 2016-06-01 MED FILL — DILANTIN 100 MG CAPSULE: 100 | 30 days supply | Qty: 180 | Fill #5

## 2016-07-05 MED FILL — DILANTIN 100 MG CAPSULE: 100 | 30 days supply | Qty: 180 | Fill #6

## 2016-07-31 MED FILL — LISINOPRIL 10 MG TABLET: 10 | 90 days supply | Qty: 90 | Fill #1

## 2016-07-31 MED FILL — LEVOTHYROXINE 25 MCG TABLET: 25 | 90 days supply | Qty: 90 | Fill #1

## 2016-07-31 MED FILL — HYDROCHLOROTHIAZIDE 25 MG T: 25 | 90 days supply | Qty: 90 | Fill #1

## 2016-07-31 MED FILL — DILANTIN 100 MG CAPSULE: 100 | 30 days supply | Qty: 180 | Fill #7

## 2016-07-31 MED FILL — ROSUVASTATIN CALCIUM 20 MG: 20 | 90 days supply | Qty: 90 | Fill #1

## 2016-08-07 ENCOUNTER — Encounter: Payer: Self-pay | Admitting: Nurse Practitioner

## 2016-08-07 ENCOUNTER — Ambulatory Visit (INDEPENDENT_AMBULATORY_CARE_PROVIDER_SITE_OTHER): Payer: 59 | Admitting: Nurse Practitioner

## 2016-08-07 VITALS — BP 110/74 | HR 76 | Wt 280.0 lb

## 2016-08-07 DIAGNOSIS — G40309 Generalized idiopathic epilepsy and epileptic syndromes, not intractable, without status epilepticus: Secondary | ICD-10-CM

## 2016-08-07 NOTE — Progress Notes (Signed)
[facility]  PATIENT: Anayelli B Hentz DOB: 19-Aug-1960   REASON FOR VISIT: Follow-up for generalized epilepsy  HISTORY FROM: History from patient and husband Barbara Cower    HISTORY OF PRESENT ILLNESS:UPDATE 07/23/2018CM Ms. Derryberry, 56 year old female returns for follow-up. When last seen in May she  had 6 seizures over the weekend she had been compliant with her Dilantin which was therapeutic. Keppra was added to her drug regimen. She has not had further seizure events since that time. She denies any side effects to the Keppra. She is not driving and she cannot drive for 6 months from her recent seizure activity. She returns for reevaluation   UPDATE 05/08/2018CM Ms. Basey, 56 year old female returns for follow-up with her husband. She has a history of seizure disorder. Most recent Dilantin level at primary care's office on 04/25/2016 was 18.9. She is on brand Dilantin taking 3 capsules twice a day. She denies missing any doses. On Friday of last week she had 3 seizure episodes about an hour apart, one occurred in her sleep , on Saturday she had 2 additional seizure events at home Sunday she had one in church. Husband witnessed some of her events where she was trembling on one side of body and not aware. She did not bite her tongue. There was no loss of bowel or bladder control. She was confused for several hours afterwards. Prior to this her last seizure was July 2012 and that was a brief staring episode. She denies any signs and symptoms of infection. She does say she has lost 30 pounds in the last 3 months. She returns for reevaluation   UPDATE 12/27/15 CMMs Garciagarcia, 56 year old returns for yearly follow up.  She has a history of seizures. Last seizure activity was a brief staring episode in July 2012, and the Dilantin level was found to be low in the 6.0 range. The patient was increased on the Dilantin. She had repeat level done 10/08/2013, in Dr. Kathi Der office, level was 25.6. Dose  was reduced to 600 mg of Dilantin daily (BRAND). The patient has done well on this dose, and denies any side effects such as drowsiness or gait instability. CBC and liver function done in Dr. Kathi Der office 12/21/15  were within normal limits. Dilantin level 16.6.Patient returns for reevaluationand refills    REVIEW OF SYSTEMS: Full 14 system review of systems performed and notable only for those listed, all others are neg:  Constitutional: neg  Cardiovascular: neg Ear/Nose/Throat: neg  Skin: neg Eyes: neg Respiratory: neg Gastroitestinal: neg  Hematology/Lymphatic: neg  Endocrine: neg Musculoskeletal:neg Allergy/Immunology: neg Neurological: Recent seizure activity Psychiatric: neg Sleep : neg   ALLERGIES: Allergies  Allergen Reactions  . Mushroom Extract Complex Swelling  . Penicillins     UNKNOWN REACTION  . Shellfish Allergy Swelling  . Tamiflu [Oseltamivir Phosphate] Rash    Severe rash, hives    HOME MEDICATIONS: Outpatient Medications Prior to Visit  Medication Sig Dispense Refill  . ALPRAZolam (XANAX) 0.25 MG tablet Take one half to one tablet at bedtime as needed 30 tablet 0  . cholecalciferol (VITAMIN D) 1000 UNITS tablet Take 2,000 Units by mouth daily.     Marland Kitchen DILANTIN 100 MG ER capsule Take 3 capsules (300 mg total) by mouth 2 (two) times daily. 180 capsule 11  . hydrochlorothiazide (HYDRODIURIL) 25 MG tablet Take 1 tablet (25 mg total) by mouth daily. 90 tablet 3  . ibuprofen (ADVIL,MOTRIN) 200 MG tablet Take 200 mg by mouth as needed. For  pain    . levETIRAcetam (KEPPRA) 500 MG tablet Take 1 tablet (500 mg total) by mouth 2 (two) times daily. 180 tablet 1  . levothyroxine (SYNTHROID, LEVOTHROID) 25 MCG tablet Take 1 tablet (25 mcg total) by mouth daily. 90 tablet 3  . lisinopril (PRINIVIL,ZESTRIL) 10 MG tablet Take 1 tablet (10 mg total) by mouth daily. 90 tablet 3  . loratadine (CLARITIN) 10 MG tablet Take 10 mg by mouth daily.    . Multiple Vitamin  (MULTIVITAMIN WITH MINERALS) TABS tablet Take 1 tablet by mouth daily.    . rosuvastatin (CRESTOR) 20 MG tablet Take 1 tablet (20 mg total) by mouth daily. 90 tablet 3   No facility-administered medications prior to visit.     PAST MEDICAL HISTORY: Past Medical History:  Diagnosis Date  . Hyperlipidemia   . Hypertension   . Seizures (HCC)     PAST SURGICAL HISTORY: History reviewed. No pertinent surgical history.  FAMILY HISTORY: Family History  Problem Relation Age of Onset  . Diabetes Other     SOCIAL HISTORY: Social History   Social History  . Marital status: Married    Spouse name: N/A  . Number of children: N/A  . Years of education: N/A   Occupational History  . Not on file.   Social History Main Topics  . Smoking status: Never Smoker  . Smokeless tobacco: Never Used  . Alcohol use No  . Drug use: No  . Sexual activity: Yes    Birth control/ protection: None   Other Topics Concern  . Not on file   Social History Narrative  . No narrative on file     PHYSICAL EXAM  Vitals:   08/07/16 0842  BP: 110/74  Pulse: 76  Weight: 280 lb (127 kg)   Body mass index is 39.05 kg/m. Generalized: Well developed, morbidly obese female in no acute distress   Neurological examination   Mentation: Alert oriented to time, place, history taking. Follows all commands speech and language fluent  Cranial nerve II-XII: Pupils were equal round reactive to light extraocular movements were full, visual field were full on confrontational test. Facial sensation and strength were normal. hearing was intact to finger rubbing bilaterally. Uvula tongue midline. head turning and shoulder shrug and were normal and symmetric.Tongue protrusion into cheek strength was normal. Motor: normal bulk and tone, full strength in the BUE, BLE, fine finger movements normal, no pronator drift. No focal weakness Coordination: finger-nose-finger, no dysmetria.  Reflexes: Depressed and  symmetric upper and lower Gait and Station: Rising up from seated position without assistance, normal stance, moderate stride, good arm swing, smooth turning, able to perform tiptoe, and heel walking without difficulty. Tandem gait steady   DIAGNOSTIC DATA (LABS, IMAGING, TESTING) - I reviewed patient records, labs, notes, testing and imaging myself where available.  Lab Results  Component Value Date   WBC 8.0 04/24/2016   HGB 13.5 04/24/2016   HCT 40.0 04/24/2016   MCV 88 04/24/2016   PLT 219 04/24/2016      Component Value Date/Time   NA 137 04/24/2016 1655   K 4.0 04/24/2016 1655   CL 95 (L) 04/24/2016 1655   CO2 29 04/24/2016 1655   GLUCOSE 89 04/24/2016 1655   BUN 30 (H) 04/24/2016 1655   CREATININE 0.79 04/24/2016 1655   CALCIUM 9.7 04/24/2016 1655   PROT 7.4 04/24/2016 1655   ALBUMIN 4.1 04/24/2016 1655   AST 21 04/24/2016 1655   ALT 23 04/24/2016 1655  ALKPHOS 95 04/24/2016 1655   BILITOT 0.3 04/24/2016 1655   GFRNONAA 83 04/24/2016 1655   GFRAA 96 04/24/2016 1655   Lab Results  Component Value Date   CHOL 155 04/24/2016   HDL 45 04/24/2016   LDLCALC 82 04/24/2016   TRIG 139 04/24/2016   CHOLHDL 3.4 04/24/2016    Lab Results  Component Value Date   TSH 3.580 08/12/2015      ASSESSMENT AND PLAN  56 y.o. year old female  has a past medical history of Hyperlipidemia; Hypertension; and Seizures (HCC). here to follow-up.She had a total of 6 seizures  In May 2018.  She has been compliant with her medication Dilantin. Keppra was added at that time   PLAN:   Continue Dilantin at current dose  Last Dilantin level 17.4 Which is therapeutic 2 months ago Continue Keppra 500 mg 1/2 in the am and 1.5 in the pm Call for any further seizure activity No driving until seizure free for 6 months around November   F/U in 6 months Nilda RiggsNancy Carolyn Latish Toutant, [facility] VallejoGNP, [facility]BC, APRN  [facility] [address], [address] [place], [place] [address] [phone]

## 2016-08-07 NOTE — Progress Notes (Signed)
I have read the note, and I agree with the clinical assessment and plan.  WILLIS,CHARLES KEITH   

## 2016-08-07 NOTE — Patient Instructions (Signed)
Continue Dilantin at current dose  Last Dilantin level 17.4 Which is therapeutic Continue Keppra 500 mg 1/2 in the am and 1.5 in the pm Call for any further seizure activity No driving until seizure free for 6 months around November   F/U in 6 months

## 2016-08-08 MED FILL — levETIRAcetam 500 MG TABS: 500 | 90 days supply | Qty: 180 | Fill #1

## 2016-08-25 ENCOUNTER — Ambulatory Visit: Payer: 59 | Admitting: Family Medicine

## 2016-09-04 ENCOUNTER — Encounter: Payer: Self-pay | Admitting: Nurse Practitioner

## 2016-09-04 MED FILL — DILANTIN 100 MG CAPSULE: 100 | 30 days supply | Qty: 180 | Fill #8

## 2016-09-13 ENCOUNTER — Telehealth: Payer: Self-pay | Admitting: Nurse Practitioner

## 2016-09-13 ENCOUNTER — Ambulatory Visit (INDEPENDENT_AMBULATORY_CARE_PROVIDER_SITE_OTHER): Payer: 59 | Admitting: Family Medicine

## 2016-09-13 ENCOUNTER — Encounter: Payer: Self-pay | Admitting: Family Medicine

## 2016-09-13 VITALS — BP 124/81 | HR 91 | Temp 97.0°F | Ht 71.0 in | Wt 278.0 lb

## 2016-09-13 DIAGNOSIS — G40909 Epilepsy, unspecified, not intractable, without status epilepticus: Secondary | ICD-10-CM

## 2016-09-13 DIAGNOSIS — E039 Hypothyroidism, unspecified: Secondary | ICD-10-CM | POA: Diagnosis not present

## 2016-09-13 DIAGNOSIS — E559 Vitamin D deficiency, unspecified: Secondary | ICD-10-CM

## 2016-09-13 DIAGNOSIS — I10 Essential (primary) hypertension: Secondary | ICD-10-CM | POA: Diagnosis not present

## 2016-09-13 DIAGNOSIS — E7800 Pure hypercholesterolemia, unspecified: Secondary | ICD-10-CM

## 2016-09-13 DIAGNOSIS — E8881 Metabolic syndrome: Secondary | ICD-10-CM | POA: Diagnosis not present

## 2016-09-13 MED ORDER — DILANTIN 100 MG PO CAPS: 300.0000 mg | ORAL_CAPSULE | Freq: Two times a day (BID) | ORAL | 11 refills | Status: DC

## 2016-09-13 NOTE — Telephone Encounter (Signed)
Paper  Work has been Building services engineer out for [employer] . I will need a printed RX for Dilantin  DILANTIN 100 MG ER capsule 180 capsule 11 12/27/2015    Sig - Route: Take 3 capsules (300 mg total) by mouth 2 (two) times daily. - Oral   Sent to pharmacy as: DILANTIN 100 MG ER capsule   Andrey Campanile I will put paper on your desk.

## 2016-09-13 NOTE — Addendum Note (Signed)
Addended by: Guy BeginYOUNG, Haizlee Henton S on: 09/13/2016 11:48 AM   Modules accepted: Orders

## 2016-09-13 NOTE — Progress Notes (Signed)
Subjective:    Patient ID: Maria Rivers, female    DOB: 1960/06/17, 56 y.o.   MRN: [id number]  HPI Pt here for follow up and management of chronic medical problems which includes hyperlipidemia and hypertension. She is taking medication regularly.The patient is doing well overall and has no complaints today. She has a generalized seizure disorder and does see the neurologist periodically and she is taking both Dilantin and Keppra now. She has to have brand name Dilantin. The patient refuses all health maintenance recommendations including Pap smear mammogram DEXA scan chest x-ray and FOBT. She will get lab work today will make sure we send a copy of this to the neurologist. Her weight is down a couple pounds since the last visit and weight loss is always good. The body mass index is 39.05. She has hyperlipidemia and hypertension with a good blood pressure today and she is considered morbidly obese because of 2 or more comorbidities risk factors with a BMI greater than 35. She is currently taking HCTZ levothyroxin lisinopril Crestor and Dilantin. Along with Keppra. The patient denies any chest pain or shortness of breath. She denies any trouble with her stomach including nausea vomiting diarrhea blood in the stool or black tarry bowel movements. She is passing her water without problems. He is she has been off of the Dilantin for 2-3 days because of the inability to get this because her prescription card ran out she is back on this and she did not have any seizure activity during that time. We will not do the drug levels today because of that we will wait and get those in the next 4 weeks. The patient will come back at her convenience and we will do all the blood work then.    Patient Active Problem List   Diagnosis Date Noted  . Encounter for therapeutic drug monitoring 05/23/2016  . Metabolic syndrome 96/28/3662  . Hyperlipidemia 11/28/2012  . Hypothyroidism 11/28/2012  . Hypertension 11/28/2012    . 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 Encounter for long-term (current) use of other medications 11/14/2012   Outpatient Encounter Prescriptions as of 09/13/2016  Medication Sig  . ALPRAZolam (XANAX) 0.25 MG tablet Take one half to one tablet at bedtime as needed  . cholecalciferol (VITAMIN D) 1000 UNITS tablet Take 2,000 Units by mouth daily.   Marland Kitchen DILANTIN 100 MG ER capsule Take 3 capsules (300 mg total) by mouth 2 (two) times daily.  . hydrochlorothiazide (HYDRODIURIL) 25 MG tablet Take 1 tablet (25 mg total) by mouth daily.  Marland Kitchen ibuprofen (ADVIL,MOTRIN) 200 MG tablet Take 200 mg by mouth as needed. For pain  . levETIRAcetam (KEPPRA) 500 MG tablet Take 1 tablet (500 mg total) by mouth 2 (two) times daily.  Marland Kitchen levothyroxine (SYNTHROID, LEVOTHROID) 25 MCG tablet Take 1 tablet (25 mcg total) by mouth daily.  Marland Kitchen lisinopril (PRINIVIL,ZESTRIL) 10 MG tablet Take 1 tablet (10 mg total) by mouth daily.  Marland Kitchen loratadine (CLARITIN) 10 MG tablet Take 10 mg by mouth daily.  . Multiple Vitamin (MULTIVITAMIN WITH MINERALS) TABS tablet Take 1 tablet by mouth daily.  . rosuvastatin (CRESTOR) 20 MG tablet Take 1 tablet (20 mg total) by mouth daily.   No facility-administered encounter medications on file as of 09/13/2016.       Review of Systems  Constitutional: Negative.   HENT: Negative.   Eyes: Negative.   Respiratory: Negative.   Cardiovascular: Negative.   Gastrointestinal: Negative.   Endocrine: Negative.   Genitourinary: Negative.  Musculoskeletal: Negative.   Skin: Negative.   Allergic/Immunologic: Negative.   Neurological: Negative.   Hematological: Negative.   Psychiatric/Behavioral: Negative.        Objective:   Physical Exam  Constitutional: She is oriented to person, place, and time. She appears well-developed and well-nourished. No distress.  As patient is pleasant and alert and has a history of generalized seizure disorder. She also has morbid obesity. She also  refuses to get any help maintenance things done here that we ask her at every visit to do these.  HENT:  Head: Normocephalic and atraumatic.  Right Ear: External ear normal.  Left Ear: External ear normal.  Mouth/Throat: Oropharynx is clear and moist. No oropharyngeal exudate.  Nasal congestion bilaterally and the patient remains on her Claritin.  Eyes: Pupils are equal, round, and reactive to light. Conjunctivae and EOM are normal. Right eye exhibits no discharge. Left eye exhibits no discharge. No scleral icterus.  She plans to get an eye exam soon.  Neck: Normal range of motion. Neck supple. No thyromegaly present.  No bruits thyromegaly or anterior cervical adenopathy  Cardiovascular: Normal rate, regular rhythm, normal heart sounds and intact distal pulses.   No murmur heard. Heart has a regular rate and rhythm at 84/m  Pulmonary/Chest: Effort normal and breath sounds normal. No respiratory distress. She has no wheezes. She has no rales.  Clear anteriorly and posteriorly  Abdominal: Soft. Bowel sounds are normal. She exhibits no mass. There is no tenderness. There is no rebound and no guarding.  Abdominal obesity without masses tenderness or organ enlargement or bruits  Musculoskeletal: Normal range of motion. She exhibits no edema.  Lymphadenopathy:    She has no cervical adenopathy.  Neurological: She is alert and oriented to person, place, and time. She has normal reflexes. No cranial nerve deficit.  Skin: Skin is warm and dry. No rash noted.  Psychiatric: She has a normal mood and affect. Her behavior is normal. Judgment and thought content normal.  Nursing note and vitals reviewed.  BP 124/81 (BP Location: Right Arm)   Pulse 91   Temp (!) 97 F (36.1 C) (Oral)   Ht '5\' 11"'$  (1.803 m)   Wt 278 lb (126.1 kg)   LMP 03/16/2011   BMI 38.77 kg/m         Assessment & Plan:  1. Essential hypertension -The blood pressure is good today and she will continue with her current  treatment and sodium restriction and weight loss  - BMP8+EGFR; Future - CBC with Differential/Platelet; Future - Hepatic function panel; Future  2. Pure hypercholesterolemia -Continue with current treatment pending results of lab work - Lipid panel; Future - CBC with Differential/Platelet; Future  3. Seizure disorder ([place]) -Continue with current treatment take seizure medicines regularly. Follow-up with neurologist as recommended - Levetiracetam level; Future - Phenytoin level, free and total; Future - CBC with Differential/Platelet; Future  4. Vitamin D deficiency -Continue with vitamin D replacement pending results of lab work - VITAMIN D 25 Hydroxy (Vit-D Deficiency, Fractures); Future - CBC with Differential/Platelet; Future  5. Hypothyroidism, unspecified type -Continue with thyroid replacement pending results of lab work - Thyroid Panel With TSH; Future - CBC with Differential/Platelet; Future  6. Metabolic syndrome -The patient had lost 2 pounds since last visit and she will continue to work as aggressively as possible with weight loss through diet and exercise - CBC with Differential/Platelet; Future  7. Morbid obesity due to excess calories [facility]) -She is still classified as morbidly  obese because of 2 or more comorbidities risk factors and a BMI greater than 35. She will work aggressively on therapeutic lifestyle changes and continue to make all efforts to lose weight through diet and exercise. - CBC with Differential/Platelet; Future  Patient Instructions  Continue current medications. Continue good therapeutic lifestyle changes which include good diet and exercise. Fall precautions discussed with patient. If an FOBT was given today- please return it to our front desk. If you are over 30 years old - you may need Prevnar 74 or the adult Pneumonia vaccine.  **Flu shots are available--- please call and schedule a FLU-CLINIC appointment**  After your visit with [place]  today you will receive a survey in the mail or online from [employer] regarding your care with [place]. Please take a moment to fill this out. Your feedback is very important to [place] as you can help [place] better understand your patient needs as well as improve your experience and satisfaction. WE CARE ABOUT YOU!!!   The patient should continue with her current meds She should highly consider getting some of her health maintenance issues that we recommended We will delay getting the drug levels for Keppra and Dilantin because she was off the Dilantin for a few days but is now back on that. We will do all of the blood work at one time when she is back on her seizure medicines regularly. We will call with the lab results as soon as they become available and make sure that Dr. Jannifer Franklin is sent a copy of these for review  Arrie Senate MD

## 2016-09-13 NOTE — Telephone Encounter (Signed)
Done.  To CM/NP for signature.

## 2016-09-13 NOTE — Patient Instructions (Addendum)
Continue current medications. Continue good therapeutic lifestyle changes which include good diet and exercise. Fall precautions discussed with patient. If an FOBT was given today- please return it to our front desk. If you are over 58 years old - you may need Prevnar 13 or the adult Pneumonia vaccine.  **Flu shots are available--- please call and schedule a FLU-CLINIC appointment**  After your visit with us today you will receive a survey in the mail or online from [employer] regarding your care with us. Please take a moment to fill this out. Your feedback is very important to us as you can help us better understand your patient needs as well as improve your experience and satisfaction. WE CARE ABOUT YOU!!!   The patient should continue with her current meds She should highly consider getting some of her health maintenance issues that we recommended We will delay getting the drug levels for Keppra and Dilantin because she was off the Dilantin for a few days but is now back on that. We will do all of the blood work at one time when she is back on her seizure medicines regularly. We will call with the lab results as soon as they become available and make sure that Dr. Anne HahnWillis is sent a copy of these for review

## 2016-09-14 NOTE — Telephone Encounter (Signed)
Noted Patient assistance has been faxed.

## 2016-09-27 ENCOUNTER — Other Ambulatory Visit: Payer: 59

## 2016-09-27 DIAGNOSIS — E039 Hypothyroidism, unspecified: Secondary | ICD-10-CM

## 2016-09-27 DIAGNOSIS — E559 Vitamin D deficiency, unspecified: Secondary | ICD-10-CM

## 2016-09-27 DIAGNOSIS — E8881 Metabolic syndrome: Secondary | ICD-10-CM

## 2016-09-27 DIAGNOSIS — E7800 Pure hypercholesterolemia, unspecified: Secondary | ICD-10-CM

## 2016-09-27 DIAGNOSIS — G40909 Epilepsy, unspecified, not intractable, without status epilepticus: Secondary | ICD-10-CM | POA: Diagnosis not present

## 2016-09-27 DIAGNOSIS — I10 Essential (primary) hypertension: Secondary | ICD-10-CM | POA: Diagnosis not present

## 2016-09-28 ENCOUNTER — Telehealth: Payer: Self-pay | Admitting: *Deleted

## 2016-09-28 NOTE — Telephone Encounter (Signed)
Pt called wanting lab results. All labs results given to pt

## 2016-10-04 NOTE — Telephone Encounter (Signed)
Called patient to give her specific instructions regarding increasing Keppra and continuing Dilantin until reaching maximum dose of Keppra. Patient stated she cannot afford to buy any more Dilantin, and she cannot increase Keppra due to sleepiness it causes.   Per NP, advised the patient NP will discuss with Dr Anne Hahn, and she will get a call back. Patient then asked for number to [employer]; advised she probably can look it up on her computer. She stated she would do that, verbalized understanding.

## 2016-10-04 NOTE — Telephone Encounter (Signed)
If she had SE to generic DILantin she will have to maximize her dose of Keppra. She is currently taking  BIDnot enough for stand alone seizure medicine.  she will need to increase to 2 tabs twice daily for 1 week then 3 tabs twice daily. At that point she will need to get a Keppra level.You cannot suddenly go off the Dilantin . She only has enough to last till Friday.

## 2016-10-04 NOTE — Telephone Encounter (Signed)
Patient was denied for Patient assistance for Dilantin because her income was two High . Patient states she cant afford to pay for Dilantin at this time. Patient want's to Know if the Keppra will just help her ?

## 2016-10-04 NOTE — Telephone Encounter (Signed)
Called patient who stated she cannot afford Dilantin, cost is $200/month. This RN looked at cost on [employer], $108, but patient stated she still cannot afford it, as she is the only income for her home. Stated she must take brand only. She stated that she has not had any seizures since starting Keppra. She has enough Dilantin to last until this Friday. She is asking if she can take only Keppra. This RN stated will route her concerns, questions to NP and call her back later today.  She verbalized understanding, appreciation.

## 2016-10-04 NOTE — Telephone Encounter (Signed)
I have wanted her to stay on Dilantin because with a therapeutic level she still has seizures. That was the reason for Keppra being added. Please ask if she has been on generic dilantin in the past.

## 2016-10-04 NOTE — Telephone Encounter (Signed)
I called the patient and left message of plan to try to obtain PA from insurance for BRAND Dilantin since patient had side effects to generic dilantin. Pt is on poly pharmacy for her seizure disorder.

## 2016-10-05 ENCOUNTER — Encounter: Payer: Self-pay | Admitting: Nurse Practitioner

## 2016-10-05 ENCOUNTER — Encounter: Payer: Self-pay | Admitting: *Deleted

## 2016-10-05 MED FILL — DILANTIN 100 MG CAPSULE: 100 | 30 days supply | Qty: 180 | Fill #9

## 2016-10-05 NOTE — Telephone Encounter (Addendum)
I called [facility], spoke to tech, and was told that her 30 days Dilantin BN would be $21.60 with coupon,  ? [employer], used last Dec. (not sure what coupon that is with ?? Pap??).  Pt to keep on her medications.  LMVM for pt on her cell. She is to call back if needed.

## 2016-10-05 NOTE — Telephone Encounter (Signed)
I called [facility] line, spoke to ASIA.  Pt was already approved for 180 tabs/ 30 day supply for $54.27 at [facility] outpt pharm.  I asked for co pay exception [id number] in urgent 24hour turn around time.

## 2016-10-05 NOTE — Telephone Encounter (Signed)
Pt called in she said [employer] advised her yesterday there is no copay program anymore. She is wanting to ensure the $54.27 is not based on the fact that is what she was paying under the program before.  Please call to discuss.

## 2016-10-05 NOTE — Telephone Encounter (Signed)
Thanks for getting this taken care of. Please call the pt and let her know to continue the Dilantin and Keppra

## 2016-10-09 NOTE — Telephone Encounter (Signed)
Received denial for copayment exception(lower cost) for dilantin due to copay exceptions are not covered benefit under you pharmacy plan.

## 2016-10-10 LAB — HEPATIC FUNCTION PANEL
ALT: 22 IU/L (ref 0–32)
AST: 19 IU/L (ref 0–40)
Albumin: 4.1 g/dL (ref 3.5–5.5)
Alkaline Phosphatase: 109 IU/L (ref 39–117)
Bilirubin Total: 0.3 mg/dL (ref 0.0–1.2)
Bilirubin, Direct: 0.11 mg/dL (ref 0.00–0.40)
Total Protein: 7.2 g/dL (ref 6.0–8.5)

## 2016-10-10 LAB — CBC WITH DIFFERENTIAL/PLATELET
BASOS: 0 %
Basophils Absolute: 0 10*3/uL (ref 0.0–0.2)
EOS (ABSOLUTE): 0.2 10*3/uL (ref 0.0–0.4)
Eos: 4 %
Hematocrit: 37.6 % (ref 34.0–46.6)
Hemoglobin: 13 g/dL (ref 11.1–15.9)
IMMATURE GRANULOCYTES: 0 %
Immature Grans (Abs): 0 10*3/uL (ref 0.0–0.1)
Lymphocytes Absolute: 1.8 10*3/uL (ref 0.7–3.1)
Lymphs: 30 %
MCH: 30 pg (ref 26.6–33.0)
MCHC: 34.6 g/dL (ref 31.5–35.7)
MCV: 87 fL (ref 79–97)
MONOS ABS: 0.4 10*3/uL (ref 0.1–0.9)
Monocytes: 7 %
NEUTROS PCT: 59 %
Neutrophils Absolute: 3.5 10*3/uL (ref 1.4–7.0)
PLATELETS: 211 10*3/uL (ref 150–379)
RBC: 4.33 x10E6/uL (ref 3.77–5.28)
RDW: 14.1 % (ref 12.3–15.4)
WBC: 5.9 10*3/uL (ref 3.4–10.8)

## 2016-10-10 LAB — BMP8+EGFR
BUN/Creatinine Ratio: 27 — ABNORMAL HIGH (ref 9–23)
BUN: 23 mg/dL (ref 6–24)
CO2: 25 mmol/L (ref 20–29)
Calcium: 9.3 mg/dL (ref 8.7–10.2)
Chloride: 98 mmol/L (ref 96–106)
Creatinine, Ser: 0.86 mg/dL (ref 0.57–1.00)
GFR calc Af Amer: 87 mL/min/{1.73_m2} (ref >59)
GFR, EST NON AFRICAN AMERICAN: 75 mL/min/{1.73_m2} (ref >59)
Glucose: 96 mg/dL (ref 65–99)
POTASSIUM: 4.6 mmol/L (ref 3.5–5.2)
SODIUM: 139 mmol/L (ref 134–144)

## 2016-10-10 LAB — LIPID PANEL
CHOLESTEROL TOTAL: 142 mg/dL (ref 100–199)
Chol/HDL Ratio: 3.3 ratio (ref 0.0–4.4)
HDL: 43 mg/dL (ref >39)
LDL Calculated: 75 mg/dL (ref 0–99)
TRIGLYCERIDES: 118 mg/dL (ref 0–149)
VLDL Cholesterol Cal: 24 mg/dL (ref 5–40)

## 2016-10-10 LAB — THYROID PANEL WITH TSH
FREE THYROXINE INDEX: 1.9 (ref 1.2–4.9)
T3 UPTAKE RATIO: 25 % (ref 24–39)
T4, Total: 7.5 ug/dL (ref 4.5–12.0)
TSH: 2.53 u[IU]/mL (ref 0.450–4.500)

## 2016-10-10 LAB — PHENYTOIN LEVEL, FREE AND TOTAL
Phenytoin, Free: NOT DETECTED ug/mL (ref 1.0–2.0)
Phenytoin, Serum: 16.7 ug/mL (ref 10.0–20.0)

## 2016-10-10 LAB — VITAMIN D 25 HYDROXY (VIT D DEFICIENCY, FRACTURES): Vit D, 25-Hydroxy: 48 ng/mL (ref 30.0–100.0)

## 2016-10-10 LAB — LEVETIRACETAM LEVEL: LEVETIRACETAM: 13.8 ug/mL (ref 10.0–40.0)

## 2016-10-30 MED FILL — HYDROCHLOROTHIAZIDE 25 MG T: 25 | 90 days supply | Qty: 90 | Fill #2

## 2016-10-30 MED FILL — LEVOTHYROXINE 25 MCG TABLET: 25 | 90 days supply | Qty: 90 | Fill #2

## 2016-10-30 MED FILL — LISINOPRIL 10 MG TABS: 10 | 90 days supply | Qty: 90 | Fill #2

## 2016-10-30 MED FILL — DILANTIN 100 MG CAPSULE: 100 | 30 days supply | Qty: 180 | Fill #10

## 2016-10-30 MED FILL — ROSUVASTATIN CALCIUM 20 MG: 20 | 90 days supply | Qty: 90 | Fill #2

## 2016-11-04 ENCOUNTER — Encounter: Payer: Self-pay | Admitting: Nurse Practitioner

## 2016-11-06 MED ORDER — LEVETIRACETAM 500 MG PO TABS: 500.0000 mg | ORAL_TABLET | Freq: Two times a day (BID) | ORAL | 1 refills | Status: DC

## 2016-11-06 MED FILL — levETIRAcetam 500 MG TABS: 500 | 90 days supply | Qty: 180 | Fill #0

## 2016-12-04 MED FILL — DILANTIN 100 MG CAPSULE: 100 | 30 days supply | Qty: 180 | Fill #11

## 2016-12-15 DIAGNOSIS — H5203 Hypermetropia, bilateral: Secondary | ICD-10-CM | POA: Diagnosis not present

## 2016-12-15 DIAGNOSIS — H524 Presbyopia: Secondary | ICD-10-CM | POA: Diagnosis not present

## 2016-12-15 DIAGNOSIS — H52223 Regular astigmatism, bilateral: Secondary | ICD-10-CM | POA: Diagnosis not present

## 2016-12-26 ENCOUNTER — Ambulatory Visit: Payer: 59 | Admitting: Nurse Practitioner

## 2017-01-01 ENCOUNTER — Encounter: Payer: Self-pay | Admitting: Nurse Practitioner

## 2017-01-01 ENCOUNTER — Other Ambulatory Visit: Payer: Self-pay | Admitting: Nurse Practitioner

## 2017-01-01 MED FILL — DILANTIN 100 MG CAPSULE: 100 | 30 days supply | Qty: 180 | Fill #0

## 2017-01-03 ENCOUNTER — Encounter: Payer: Self-pay | Admitting: Family Medicine

## 2017-01-03 ENCOUNTER — Ambulatory Visit: Payer: 59 | Admitting: Family Medicine

## 2017-01-03 ENCOUNTER — Ambulatory Visit (INDEPENDENT_AMBULATORY_CARE_PROVIDER_SITE_OTHER): Payer: 59 | Admitting: Family Medicine

## 2017-01-03 VITALS — BP 123/87 | HR 89 | Temp 97.9°F | Ht 71.0 in | Wt 282.0 lb

## 2017-01-03 DIAGNOSIS — G40909 Epilepsy, unspecified, not intractable, without status epilepticus: Secondary | ICD-10-CM | POA: Diagnosis not present

## 2017-01-03 DIAGNOSIS — S8011XA Contusion of right lower leg, initial encounter: Secondary | ICD-10-CM | POA: Diagnosis not present

## 2017-01-03 DIAGNOSIS — E039 Hypothyroidism, unspecified: Secondary | ICD-10-CM

## 2017-01-03 DIAGNOSIS — I10 Essential (primary) hypertension: Secondary | ICD-10-CM | POA: Diagnosis not present

## 2017-01-03 DIAGNOSIS — E559 Vitamin D deficiency, unspecified: Secondary | ICD-10-CM

## 2017-01-03 DIAGNOSIS — E7800 Pure hypercholesterolemia, unspecified: Secondary | ICD-10-CM

## 2017-01-03 NOTE — Patient Instructions (Addendum)
Continue current medications. Continue good therapeutic lifestyle changes which include good diet and exercise. Fall precautions discussed with patient. If an FOBT was given today- please return it to our front desk. If you are over 58 years old - you may need Prevnar 13 or the adult Pneumonia vaccine.  **Flu shots are available--- please call and schedule a FLU-CLINIC appointment**  After your visit with us today you will receive a survey in the mail or online from [employer] regarding your care with us. Please take a moment to fill this out. Your feedback is very important to us as you can help us better understand your patient needs as well as improve your experience and satisfaction. WE CARE ABOUT YOU!!!   Return to the office for fasting lab work Follow-up with neurology as planned Use ice over the areas of bruises on right lateral leg and right elbow after 48-72 hours she can switch to warm wet compresses. Continue to drink plenty of fluids and make all efforts to lose weight through diet and exercise

## 2017-01-03 NOTE — Addendum Note (Signed)
Addended by: Magdalene RiverBULLINS, Kumari Sculley H on: 01/03/2017 05:03 PM   Modules accepted: Orders

## 2017-01-03 NOTE — Progress Notes (Signed)
Subjective:    Patient ID: Maria Rivers, female    DOB: 1960-10-18, 56 y.o.   MRN: [id number]  HPI Pt here for follow up and management of chronic medical problems which includes hypertension, hyperlipidemia and seizure disorder. She is taking medication regularly.  The patient today is doing well other than complaints of soreness from a fall.  She continues to refuse having a Pap smear mammogram DEXA scan chest x-ray or FOBT.  She will get lab work today.  She got her flu shot at work.  She will check with her insurance regarding the Prevnar vaccine.  Her weight is up 4 pounds this time.  This patient has a history of hyperlipidemia hypertension morbid obesity and seizure disorder.  She does see the neurologist yearly.  The patient takes Dilantin brand name and Keppra.  She is on thyroid replacement.  She takes lisinopril for her blood pressure and Crestor for her cholesterol.  The patient is doing well and as usual has no complaints.  She has not had any further seizure activity.  She denies chest pain shortness of breath or problems with her stomach including nausea vomiting diarrhea blood in the stool or change in bowel habits.  She is passing her water without problems.  She does have a contusion on her right leg and her right elbow and it does not appear that she needs any kind of x-ray for this.     Patient Active Problem List   Diagnosis Date Noted  . Encounter for therapeutic drug monitoring 05/23/2016  . Metabolic syndrome 45/36/4680  . Hyperlipidemia 11/28/2012  . Hypothyroidism 11/28/2012  . Hypertension 11/28/2012  . 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 Encounter for long-term (current) use of other medications 11/14/2012   Outpatient Encounter Medications as of 01/03/2017  Medication Sig  . ALPRAZolam (XANAX) 0.25 MG tablet Take one half to one tablet at bedtime as needed  . cholecalciferol (VITAMIN D) 1000 UNITS tablet Take 2,000 Units by mouth daily.   Marland Kitchen  DILANTIN 100 MG ER capsule Take 3 capsules (300 mg total) by mouth 2 (two) times daily.  . hydrochlorothiazide (HYDRODIURIL) 25 MG tablet Take 1 tablet (25 mg total) by mouth daily.  Marland Kitchen ibuprofen (ADVIL,MOTRIN) 200 MG tablet Take 200 mg by mouth as needed. For pain  . levETIRAcetam (KEPPRA) 500 MG tablet Take 1 tablet (500 mg total) by mouth 2 (two) times daily. Take 1/2 tablet in the AM and 1.5 tablets in the PM  . levothyroxine (SYNTHROID, LEVOTHROID) 25 MCG tablet Take 1 tablet (25 mcg total) by mouth daily.  Marland Kitchen lisinopril (PRINIVIL,ZESTRIL) 10 MG tablet Take 1 tablet (10 mg total) by mouth daily.  Marland Kitchen loratadine (CLARITIN) 10 MG tablet Take 10 mg by mouth daily.  . Multiple Vitamin (MULTIVITAMIN WITH MINERALS) TABS tablet Take 1 tablet by mouth daily.  . rosuvastatin (CRESTOR) 20 MG tablet Take 1 tablet (20 mg total) by mouth daily.   No facility-administered encounter medications on file as of 01/03/2017.      Review of Systems  Constitutional: Negative.   HENT: Negative.   Eyes: Negative.   Respiratory: Negative.   Cardiovascular: Negative.   Gastrointestinal: Negative.   Endocrine: Negative.   Genitourinary: Negative.   Musculoskeletal: Positive for myalgias (from a fall ).  Skin: Negative.   Allergic/Immunologic: Negative.   Neurological: Negative.   Hematological: Negative.   Psychiatric/Behavioral: Negative.        Objective:   Physical Exam  Constitutional: She is oriented  to person, place, and time. She appears well-developed and well-nourished. No distress.  Patient is pleasant and alert and doing well.  HENT:  Head: Normocephalic and atraumatic.  Right Ear: External ear normal.  Left Ear: External ear normal.  Nose: Nose normal.  Mouth/Throat: Oropharynx is clear and moist.  Eyes: Conjunctivae and EOM are normal. Pupils are equal, round, and reactive to light. Right eye exhibits no discharge. Left eye exhibits no discharge. No scleral icterus.  Neck: Normal range  of motion. Neck supple. No thyromegaly present.  No bruits thyromegaly or anterior cervical adenopathy  Cardiovascular: Normal rate, regular rhythm and normal heart sounds.  No murmur heard. Heart has a regular rate and rhythm at 96/min pedal pulses were difficult to find in either foot.  Pulmonary/Chest: Effort normal and breath sounds normal. No respiratory distress. She has no wheezes. She has no rales.  Clear anteriorly and posteriorly  Abdominal: Soft. Bowel sounds are normal. She exhibits no mass. There is no tenderness. There is no rebound and no guarding.  Abdomen is obese without masses tenderness or organ enlargement or bruits  Musculoskeletal: Normal range of motion. She exhibits no edema.  Hematoma and contusion right lateral leg and right elbow with good mobility of both lower extremity and right upper extremity.  Lymphadenopathy:    She has no cervical adenopathy.  Neurological: She is alert and oriented to person, place, and time. She has normal reflexes. No cranial nerve deficit.  Skin: Skin is warm and dry. No rash noted.  Psychiatric: She has a normal mood and affect. Her behavior is normal. Judgment and thought content normal.  Nursing note and vitals reviewed.  BP 123/87 (BP Location: Left Arm)   Pulse 89   Temp 97.9 F (36.6 C) (Oral)   Ht 5' 11" (1.803 m)   Wt 282 lb (127.9 kg)   LMP 03/16/2011   BMI 39.33 kg/m         Assessment & Plan:  1. Essential hypertension -Blood pressure is good she will continue with current treatment - BMP8+EGFR - CBC with Differential/Platelet - Hepatic function panel  2. Pure hypercholesterolemia -10 you with current treatment pending results of lab work - CBC with Differential/Platelet - Lipid panel - Hepatic function panel  3. Seizure disorder ([place]) -Continue with current treatment and follow-up with neurologist as planned in January - CBC with Differential/Platelet - Levetiracetam level - Phenytoin level, free and  total  4. Vitamin D deficiency -Continue current treatment pending results of lab work - CBC with Differential/Platelet - VITAMIN D 25 Hydroxy (Vit-D Deficiency, Fractures)  5. Hypothyroidism, unspecified type -Continue current treatment pending results of lab work - CBC with Differential/Platelet  6. Morbid obesity due to excess calories ([place]) -Continue to work aggressively on reducing weight through diet and exercise  7. Contusion of right lower extremity, initial encounter -Apply ice for the first 48-72 hours then warm wet compresses to areas of contusions on right lateral leg and right elbow  Patient Instructions  Continue current medications. Continue good therapeutic lifestyle changes which include good diet and exercise. Fall precautions discussed with patient. If an FOBT was given today- please return it to our front desk. If you are over 72 years old - you may need Prevnar 63 or the adult Pneumonia vaccine.  **Flu shots are available--- please call and schedule a FLU-CLINIC appointment**  After your visit with [place] today you will receive a survey in the mail or online from [employer] regarding your care with  [place]. Please take a moment to fill this out. Your feedback is very important to [place] as you can help [place] better understand your patient needs as well as improve your experience and satisfaction. WE CARE ABOUT YOU!!!   Return to the office for fasting lab work Follow-up with neurology as planned Use ice over the areas of bruises on right lateral leg and right elbow after 48-72 hours she can switch to warm wet compresses. Continue to drink plenty of fluids and make all efforts to lose weight through diet and exercise  Arrie Senate MD

## 2017-01-05 ENCOUNTER — Other Ambulatory Visit: Payer: 59

## 2017-01-05 DIAGNOSIS — G40909 Epilepsy, unspecified, not intractable, without status epilepticus: Secondary | ICD-10-CM

## 2017-01-05 DIAGNOSIS — I10 Essential (primary) hypertension: Secondary | ICD-10-CM | POA: Diagnosis not present

## 2017-01-05 DIAGNOSIS — E7800 Pure hypercholesterolemia, unspecified: Secondary | ICD-10-CM | POA: Diagnosis not present

## 2017-01-05 DIAGNOSIS — E039 Hypothyroidism, unspecified: Secondary | ICD-10-CM | POA: Diagnosis not present

## 2017-01-05 DIAGNOSIS — S8011XA Contusion of right lower leg, initial encounter: Secondary | ICD-10-CM | POA: Diagnosis not present

## 2017-01-05 DIAGNOSIS — E559 Vitamin D deficiency, unspecified: Secondary | ICD-10-CM

## 2017-01-08 LAB — LEVETIRACETAM LEVEL: Levetiracetam Lvl: 14.1 ug/mL (ref 10.0–40.0)

## 2017-01-08 LAB — HEPATIC FUNCTION PANEL
ALBUMIN: 4.1 g/dL (ref 3.5–5.5)
ALK PHOS: 105 IU/L (ref 39–117)
ALT: 18 IU/L (ref 0–32)
AST: 16 IU/L (ref 0–40)
BILIRUBIN TOTAL: 0.3 mg/dL (ref 0.0–1.2)
BILIRUBIN, DIRECT: 0.09 mg/dL (ref 0.00–0.40)
TOTAL PROTEIN: 6.9 g/dL (ref 6.0–8.5)

## 2017-01-08 LAB — CBC WITH DIFFERENTIAL/PLATELET
BASOS ABS: 0 10*3/uL (ref 0.0–0.2)
Basos: 0 %
EOS (ABSOLUTE): 0.2 10*3/uL (ref 0.0–0.4)
EOS: 4 %
HEMATOCRIT: 39.6 % (ref 34.0–46.6)
HEMOGLOBIN: 13.7 g/dL (ref 11.1–15.9)
IMMATURE GRANS (ABS): 0 10*3/uL (ref 0.0–0.1)
Immature Granulocytes: 0 %
LYMPHS ABS: 1.9 10*3/uL (ref 0.7–3.1)
LYMPHS: 33 %
MCH: 30.4 pg (ref 26.6–33.0)
MCHC: 34.6 g/dL (ref 31.5–35.7)
MCV: 88 fL (ref 79–97)
MONOCYTES: 7 %
Monocytes Absolute: 0.4 10*3/uL (ref 0.1–0.9)
NEUTROS ABS: 3.1 10*3/uL (ref 1.4–7.0)
Neutrophils: 56 %
Platelets: 208 10*3/uL (ref 150–379)
RBC: 4.51 x10E6/uL (ref 3.77–5.28)
RDW: 14.3 % (ref 12.3–15.4)
WBC: 5.7 10*3/uL (ref 3.4–10.8)

## 2017-01-08 LAB — LIPID PANEL
CHOLESTEROL TOTAL: 145 mg/dL (ref 100–199)
Chol/HDL Ratio: 3.2 ratio (ref 0.0–4.4)
HDL: 45 mg/dL (ref >39)
LDL Calculated: 76 mg/dL (ref 0–99)
TRIGLYCERIDES: 121 mg/dL (ref 0–149)
VLDL CHOLESTEROL CAL: 24 mg/dL (ref 5–40)

## 2017-01-08 LAB — BMP8+EGFR
BUN / CREAT RATIO: 29 — AB (ref 9–23)
BUN: 24 mg/dL (ref 6–24)
CO2: 25 mmol/L (ref 20–29)
CREATININE: 0.83 mg/dL (ref 0.57–1.00)
Calcium: 9.7 mg/dL (ref 8.7–10.2)
Chloride: 99 mmol/L (ref 96–106)
GFR calc Af Amer: 90 mL/min/{1.73_m2} (ref >59)
GFR, EST NON AFRICAN AMERICAN: 78 mL/min/{1.73_m2} (ref >59)
GLUCOSE: 95 mg/dL (ref 65–99)
Potassium: 4.4 mmol/L (ref 3.5–5.2)
Sodium: 141 mmol/L (ref 134–144)

## 2017-01-08 LAB — PHENYTOIN LEVEL, FREE AND TOTAL
PHENYTOIN FREE: 1.6 ug/mL (ref 1.0–2.0)
Phenytoin, Serum: 18.7 ug/mL (ref 10.0–20.0)

## 2017-01-08 LAB — VITAMIN D 25 HYDROXY (VIT D DEFICIENCY, FRACTURES): VIT D 25 HYDROXY: 41.4 ng/mL (ref 30.0–100.0)

## 2017-01-29 MED FILL — DILANTIN 100 MG CAPSULE: 100 | 30 days supply | Qty: 180 | Fill #1

## 2017-01-29 MED FILL — LEVOTHYROXINE 25 MCG TABLET: 25 | 90 days supply | Qty: 90 | Fill #3

## 2017-01-29 MED FILL — levETIRAcetam 500 MG TABS: 500 | 90 days supply | Qty: 180 | Fill #1

## 2017-01-29 MED FILL — HYDROCHLOROTHIAZIDE 25 MG T: 25 | 90 days supply | Qty: 90 | Fill #3

## 2017-01-29 MED FILL — LISINOPRIL 10 MG TABS: 10 | 90 days supply | Qty: 90 | Fill #3

## 2017-01-29 MED FILL — ROSUVASTATIN CALCIUM 20 MG: 20 | 90 days supply | Qty: 90 | Fill #3

## 2017-02-07 NOTE — Progress Notes (Signed)
[facility]  PATIENT: Katha B Curci DOB: 1960/05/02   REASON FOR VISIT: Follow-up for generalized epilepsy  HISTORY FROM: History from patient     HISTORY OF PRESENT ILLNESS:UPDATE 1/24/2019CM Ms. Lamagna, 57 year old female returns for follow-up with history of generalized seizure disorder.  Last seizure occurred in May 2018.  She is currently on Dilantin 300 mg twice a day.  She is on Keppra 500 mg half a tablet in the morning and 1.5 tablets in the p.m.Marland Kitchen  She denies side effects to the medication.  Recent labs drawn in primary care office 01/05/2017 with Keppra level 14.1, Dilantin level 18.7.  CBC within normal limits BMP within normal limits.  She returns for reevaluation   UPDATE 07/23/2018CM Ms. Lanphere, 57 year old female returns for follow-up. When last seen in May she  had 6 seizures over the weekend she had been compliant with her Dilantin which was therapeutic. Keppra was added to her drug regimen. She has not had further seizure events since that time. She denies any side effects to the Keppra. She is not driving and she cannot drive for 6 months from her recent seizure activity. She returns for reevaluation   UPDATE 05/08/2018CM Ms. Lucero, 57 year old female returns for follow-up with her husband. She has a history of seizure disorder. Most recent Dilantin level at primary care's office on 04/25/2016 was 18.9. She is on brand Dilantin taking 3 capsules twice a day. She denies missing any doses. On Friday of last week she had 3 seizure episodes about an hour apart, one occurred in her sleep , on Saturday she had 2 additional seizure events at home Sunday she had one in church. Husband witnessed some of her events where she was trembling on one side of body and not aware. She did not bite her tongue. There was no loss of bowel or bladder control. She was confused for several hours afterwards. Prior to this her last seizure was July 2012 and that was a brief staring  episode. She denies any signs and symptoms of infection. She does say she has lost 30 pounds in the last 3 months. She returns for reevaluation   UPDATE 12/27/15 CMMs Rathman, 57 year old returns for yearly follow up.  She has a history of seizures. Last seizure activity was a brief staring episode in July 2012, and the Dilantin level was found to be low in the 6.0 range. The patient was increased on the Dilantin. She had repeat level done 10/08/2013, in Dr. Kathi Der office, level was 25.6. Dose was reduced to 600 mg of Dilantin daily (BRAND). The patient has done well on this dose, and denies any side effects such as drowsiness or gait instability. CBC and liver function done in Dr. Kathi Der office 12/21/15  were within normal limits. Dilantin level 16.6.Patient returns for reevaluationand refills    REVIEW OF SYSTEMS: Full 14 system review of systems performed and notable only for those listed, all others are neg:  Constitutional: neg  Cardiovascular: neg Ear/Nose/Throat: neg  Skin: neg Eyes: neg Respiratory: neg Gastroitestinal: neg  Hematology/Lymphatic: neg  Endocrine: neg Musculoskeletal:neg Allergy/Immunology: neg Neurological: History of generalized seizure activity Psychiatric: neg Sleep : neg   ALLERGIES: Allergies  Allergen Reactions  . Mushroom Extract Complex Swelling  . Penicillins     UNKNOWN REACTION  . Shellfish Allergy Swelling  . Tamiflu [Oseltamivir Phosphate] Rash    Severe rash, hives    HOME MEDICATIONS: Outpatient Medications Prior to Visit  Medication Sig Dispense Refill  .  ALPRAZolam (XANAX) 0.25 MG tablet Take one half to one tablet at bedtime as needed 30 tablet 0  . cholecalciferol (VITAMIN D) 1000 UNITS tablet Take 2,000 Units by mouth daily.     Marland Kitchen. DILANTIN 100 MG ER capsule Take 3 capsules (300 mg total) by mouth 2 (two) times daily. 180 capsule 11  . hydrochlorothiazide (HYDRODIURIL) 25 MG tablet Take 1 tablet (25 mg total) by mouth daily. 90  tablet 3  . ibuprofen (ADVIL,MOTRIN) 200 MG tablet Take 200 mg by mouth as needed. For pain    . levETIRAcetam (KEPPRA) 500 MG tablet Take 1 tablet (500 mg total) by mouth 2 (two) times daily. Take 1/2 tablet in the AM and 1.5 tablets in the PM 180 tablet 1  . levothyroxine (SYNTHROID, LEVOTHROID) 25 MCG tablet Take 1 tablet (25 mcg total) by mouth daily. 90 tablet 3  . lisinopril (PRINIVIL,ZESTRIL) 10 MG tablet Take 1 tablet (10 mg total) by mouth daily. 90 tablet 3  . loratadine (CLARITIN) 10 MG tablet Take 10 mg by mouth daily.    . Multiple Vitamin (MULTIVITAMIN WITH MINERALS) TABS tablet Take 1 tablet by mouth daily.    . rosuvastatin (CRESTOR) 20 MG tablet Take 1 tablet (20 mg total) by mouth daily. 90 tablet 3   No facility-administered medications prior to visit.     PAST MEDICAL HISTORY: Past Medical History:  Diagnosis Date  . Hyperlipidemia   . Hypertension   . Seizures (HCC)     PAST SURGICAL HISTORY: History reviewed. No pertinent surgical history.  FAMILY HISTORY: Family History  Problem Relation Age of Onset  . Diabetes Other     SOCIAL HISTORY: Social History   Socioeconomic History  . Marital status: Married    Spouse name: Not on file  . Number of children: Not on file  . Years of education: Not on file  . Highest education level: Not on file  Social Needs  . Financial resource strain: Not on file  . Food insecurity - worry: Not on file  . Food insecurity - inability: Not on file  . Transportation needs - medical: Not on file  . Transportation needs - non-medical: Not on file  Occupational History    Comment: RN [employer]  Tobacco Use  . Smoking status: Never Smoker  . Smokeless tobacco: Never Used  Substance and Sexual Activity  . Alcohol use: No  . Drug use: No  . Sexual activity: Yes    Birth control/protection: None  Other Topics Concern  . Not on file  Social History Narrative  . Not on file     PHYSICAL EXAM  Vitals:   02/08/17  1525  BP: 130/74  Pulse: 88  Weight: 286 lb 9.6 oz (130 kg)   Body mass index is 39.97 kg/m. Generalized: Well developed, morbidly obese female in no acute distress   Neurological examination   Mentation: Alert oriented to time, place, history taking. Follows all commands speech and language fluent  Cranial nerve II-XII: Pupils were equal round reactive to light extraocular movements were full, visual field were full on confrontational test. Facial sensation and strength were normal. hearing was intact to finger rubbing bilaterally. Uvula tongue midline. head turning and shoulder shrug and were normal and symmetric.Tongue protrusion into cheek strength was normal. Motor: normal bulk and tone, full strength in the BUE, BLE, fine finger movements normal, no pronator drift. No focal weakness Coordination: finger-nose-finger, no dysmetria.  Reflexes: Depressed and symmetric upper and lower Gait and  Station: Rising up from seated position without assistance, normal stance, moderate stride, good arm swing, smooth turning, able to perform tiptoe, and heel walking without difficulty. Tandem gait steady   DIAGNOSTIC DATA (LABS, IMAGING, TESTING) - I reviewed patient records, labs, notes, testing and imaging myself where available.  Lab Results  Component Value Date   WBC 5.7 01/05/2017   HGB 13.7 01/05/2017   HCT 39.6 01/05/2017   MCV 88 01/05/2017   PLT 208 01/05/2017      Component Value Date/Time   NA 141 01/05/2017 1057   K 4.4 01/05/2017 1057   CL 99 01/05/2017 1057   CO2 25 01/05/2017 1057   GLUCOSE 95 01/05/2017 1057   BUN 24 01/05/2017 1057   CREATININE 0.83 01/05/2017 1057   CALCIUM 9.7 01/05/2017 1057   PROT 6.9 01/05/2017 1057   ALBUMIN 4.1 01/05/2017 1057   AST 16 01/05/2017 1057   ALT 18 01/05/2017 1057   ALKPHOS 105 01/05/2017 1057   BILITOT 0.3 01/05/2017 1057   GFRNONAA 78 01/05/2017 1057   GFRAA 90 01/05/2017 1057   Lab Results  Component Value Date    CHOL 145 01/05/2017   HDL 45 01/05/2017   LDLCALC 76 01/05/2017   TRIG 121 01/05/2017   CHOLHDL 3.2 01/05/2017    Lab Results  Component Value Date   TSH 2.530 09/27/2016      ASSESSMENT AND PLAN  57 y.o. year old female  has a past medical history of Hyperlipidemia, Hypertension, and Seizures (HCC). here to follow-up.She had a total of 6 seizures  In May 2018.  She has been compliant with her medication Dilantin. Keppra was added at that time   PLAN:   Continue Dilantin at current dose  Last Dilantin level 18.7 on 01/05/17 Continue Keppra 500 mg 1/2 in the am and 1.5 in the pm will refill Last Keppra level 14.1 on 01/05/17 Reviewed CBC BMP from 01/05/17 WNL Call for any further seizure activity Ok to drive    F/U in 8 months [name], [facility], [facility], APRN  [facility] [address] [place], [place] [address] [phone]

## 2017-02-08 ENCOUNTER — Encounter: Payer: Self-pay | Admitting: Nurse Practitioner

## 2017-02-08 ENCOUNTER — Ambulatory Visit: Payer: 59 | Admitting: Nurse Practitioner

## 2017-02-08 VITALS — BP 130/74 | HR 88 | Wt 286.6 lb

## 2017-02-08 DIAGNOSIS — G40309 Generalized idiopathic epilepsy and epileptic syndromes, not intractable, without status epilepticus: Secondary | ICD-10-CM | POA: Diagnosis not present

## 2017-02-08 MED ORDER — LEVETIRACETAM 500 MG PO TABS: 500.0000 mg | ORAL_TABLET | Freq: Two times a day (BID) | ORAL | 3 refills | Status: DC

## 2017-02-08 NOTE — Patient Instructions (Signed)
Continue Dilantin at current dose  Last Dilantin level 18.7 on 01/05/17 Continue Keppra 500 mg 1/2 in the am and 1.5 in the pm Last Keppra level 14.1 on 01/05/17 Reviewed CBC BMP Call for any further seizure activity Ok to drive    F/U in 8 months

## 2017-02-08 NOTE — Progress Notes (Signed)
I have read the note, and I agree with the clinical assessment and plan.  Maria Rivers   

## 2017-02-12 ENCOUNTER — Telehealth (INDEPENDENT_AMBULATORY_CARE_PROVIDER_SITE_OTHER): Payer: Self-pay | Admitting: *Deleted

## 2017-02-12 NOTE — Telephone Encounter (Signed)
Patient has called back and left another message for Maria Rivers to return her call regarding a question about a referral. Natoshia stated she will be out of the office 11:50-12:35.   [phone]

## 2017-02-12 NOTE — Telephone Encounter (Signed)
Patient called requesting to speak with [place]. Please call patient at work [phone]

## 2017-02-12 NOTE — Telephone Encounter (Signed)
Patient called back, I answered the question for the patient.

## 2017-02-13 NOTE — Telephone Encounter (Signed)
Attempted to call the Tyree , no answer. Will try again, this is in reference to us seeing her father in love[facility]. Dr.Rehman also rec'd information about this and he agrees to see the patient.

## 2017-02-14 ENCOUNTER — Encounter: Payer: Self-pay | Admitting: Family Medicine

## 2017-02-14 MED ORDER — ROSUVASTATIN CALCIUM 20 MG PO TABS: 20.0000 mg | ORAL_TABLET | Freq: Every day | ORAL | 0 refills | Status: DC

## 2017-02-14 MED ORDER — LEVOTHYROXINE SODIUM 25 MCG PO TABS: 25.0000 ug | ORAL_TABLET | Freq: Every day | ORAL | 0 refills | Status: DC

## 2017-02-14 MED ORDER — HYDROCHLOROTHIAZIDE 25 MG PO TABS: 25.0000 mg | ORAL_TABLET | Freq: Every day | ORAL | 0 refills | Status: DC

## 2017-02-14 MED ORDER — LISINOPRIL 10 MG PO TABS: 10.0000 mg | ORAL_TABLET | Freq: Every day | ORAL | 0 refills | Status: DC

## 2017-02-14 MED ORDER — DOXYCYCLINE HYCLATE 100 MG PO TABS: 100.0000 mg | ORAL_TABLET | Freq: Two times a day (BID) | ORAL | 0 refills | Status: DC

## 2017-02-14 NOTE — Telephone Encounter (Signed)
Called both the work number and Maria Rivers's cell number. A message was left on her cell voicemail , no voice mail on work phone. Ask that she call me back.

## 2017-02-14 NOTE — Telephone Encounter (Signed)
Spoke with patient and rx sent to pharmacy. 

## 2017-02-28 MED FILL — DILANTIN 100 MG CAPSULE: 100 | 30 days supply | Qty: 180 | Fill #2

## 2017-03-28 MED FILL — DILANTIN 100 MG CAPSULE: 100 | 30 days supply | Qty: 180 | Fill #3

## 2017-04-19 ENCOUNTER — Other Ambulatory Visit: Payer: Self-pay | Admitting: Family Medicine

## 2017-04-19 ENCOUNTER — Encounter: Payer: Self-pay | Admitting: Family Medicine

## 2017-04-20 MED ORDER — HYDROCHLOROTHIAZIDE 25 MG PO TABS: 25.0000 mg | ORAL_TABLET | Freq: Every day | ORAL | 0 refills | Status: DC

## 2017-04-20 MED ORDER — ROSUVASTATIN CALCIUM 20 MG PO TABS: 20.0000 mg | ORAL_TABLET | Freq: Every day | ORAL | 0 refills | Status: DC

## 2017-04-20 MED ORDER — LISINOPRIL 10 MG PO TABS: 10.0000 mg | ORAL_TABLET | Freq: Every day | ORAL | 0 refills | Status: DC

## 2017-04-20 MED ORDER — LEVOTHYROXINE SODIUM 25 MCG PO TABS: 25.0000 ug | ORAL_TABLET | Freq: Every day | ORAL | 0 refills | Status: DC

## 2017-04-20 MED FILL — LISINOPRIL 10 MG TABS: 10 | 90 days supply | Qty: 90 | Fill #0

## 2017-04-20 MED FILL — ROSUVASTATIN CALCIUM 20 MG: 20 | 90 days supply | Qty: 90 | Fill #0

## 2017-04-20 MED FILL — LEVOTHYROXINE 25 MCG TABLET: 25 | 90 days supply | Qty: 90 | Fill #0

## 2017-04-20 MED FILL — HYDROCHLOROTHIAZIDE 25 MG T: 25 | 90 days supply | Qty: 90 | Fill #0

## 2017-04-26 ENCOUNTER — Encounter: Payer: Self-pay | Admitting: *Deleted

## 2017-05-02 MED FILL — DILANTIN 100 MG CAPSULE: 100 | 30 days supply | Qty: 180 | Fill #4

## 2017-05-14 ENCOUNTER — Encounter: Payer: Self-pay | Admitting: Nurse Practitioner

## 2017-05-15 MED FILL — levETIRAcetam 500 MG TABS: 500 | 90 days supply | Qty: 180 | Fill #0

## 2017-05-18 ENCOUNTER — Ambulatory Visit: Payer: 59 | Admitting: Family Medicine

## 2017-05-22 ENCOUNTER — Ambulatory Visit: Payer: 59 | Admitting: Family Medicine

## 2017-05-24 ENCOUNTER — Encounter: Payer: Self-pay | Admitting: Family Medicine

## 2017-05-25 ENCOUNTER — Encounter: Payer: Self-pay | Admitting: Family Medicine

## 2017-06-01 ENCOUNTER — Ambulatory Visit: Payer: 59 | Admitting: Family Medicine

## 2017-06-01 ENCOUNTER — Encounter: Payer: Self-pay | Admitting: Family Medicine

## 2017-06-01 VITALS — BP 111/71 | HR 77 | Temp 97.6°F | Ht 71.0 in | Wt 283.0 lb

## 2017-06-01 DIAGNOSIS — I10 Essential (primary) hypertension: Secondary | ICD-10-CM | POA: Diagnosis not present

## 2017-06-01 DIAGNOSIS — E039 Hypothyroidism, unspecified: Secondary | ICD-10-CM | POA: Diagnosis not present

## 2017-06-01 DIAGNOSIS — E7800 Pure hypercholesterolemia, unspecified: Secondary | ICD-10-CM

## 2017-06-01 DIAGNOSIS — J301 Allergic rhinitis due to pollen: Secondary | ICD-10-CM | POA: Diagnosis not present

## 2017-06-01 DIAGNOSIS — E559 Vitamin D deficiency, unspecified: Secondary | ICD-10-CM

## 2017-06-01 DIAGNOSIS — G40909 Epilepsy, unspecified, not intractable, without status epilepticus: Secondary | ICD-10-CM | POA: Diagnosis not present

## 2017-06-01 DIAGNOSIS — G40309 Generalized idiopathic epilepsy and epileptic syndromes, not intractable, without status epilepticus: Secondary | ICD-10-CM

## 2017-06-01 DIAGNOSIS — Z6839 Body mass index (BMI) 39.0-39.9, adult: Secondary | ICD-10-CM | POA: Diagnosis not present

## 2017-06-01 NOTE — Progress Notes (Signed)
Subjective:    Patient ID: Maria Rivers, female    DOB: Dec 06, 1960, 57 y.o.   MRN: [id number]  HPI Pt here for follow up and management of chronic medical problems which includes hypothyroid, hypertension and hyperlipidemia. She is taking medication regularly.  The patient is doing well today and his usual has no complaints and continues to refuse to get Pap mammograms DEXA scan chest x-rays in FOBT's.  She will get her lab work today.  She is still followed periodically by the neurologist because of her seizure disorder.  Patient is on Dilantin and Keppra for seizure disorder.  She is also on thyroid replacement a low dose 25 mcg daily and takes Crestor for her cholesterol.  She takes HCTZ 25 mg daily along with lisinopril for her blood pressure.  The patient is doing well overall she has not had any seizure activity.  She sees the neurologist a little bit more frequently because of a seizure about a year ago.  That visit will be coming up not too far in the future.  She is feeling well.  She denies any chest pain pressure tightness or shortness of breath.  She denies any trouble with heartburn swallowing nausea vomiting diarrhea blood in the stool or black tarry bowel movements.  She is passing her water without problems.  She has lost a little bit of weight but continues to be in the morbidly obese category.  She refused to do all health maintenance issues after repeated persuasion's every visit with [place].  She refuses to do Pap pelvic mammogram DEXA chest x-ray and FOBT's.     Patient Active Problem List   Diagnosis Date Noted  . Encounter for therapeutic drug monitoring 05/23/2016  . Metabolic syndrome 01/24/3233  . Hyperlipidemia 11/28/2012  . Hypothyroidism 11/28/2012  . Hypertension 11/28/2012  . 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 Encounter for long-term (current) use of other medications 11/14/2012   Outpatient Encounter Medications as of 06/01/2017  Medication Sig  .  ALPRAZolam (XANAX) 0.25 MG tablet Take one half to one tablet at bedtime as needed  . cholecalciferol (VITAMIN D) 1000 UNITS tablet Take 2,000 Units by mouth daily.   Marland Kitchen DILANTIN 100 MG ER capsule Take 3 capsules (300 mg total) by mouth 2 (two) times daily.  . hydrochlorothiazide (HYDRODIURIL) 25 MG tablet Take 1 tablet (25 mg total) by mouth daily.  Marland Kitchen ibuprofen (ADVIL,MOTRIN) 200 MG tablet Take 200 mg by mouth as needed. For pain  . levETIRAcetam (KEPPRA) 500 MG tablet Take 1 tablet (500 mg total) by mouth 2 (two) times daily. Take 1/2 tablet in the AM and 1.5 tablets in the PM  . levothyroxine (SYNTHROID, LEVOTHROID) 25 MCG tablet Take 1 tablet (25 mcg total) by mouth daily.  Marland Kitchen lisinopril (PRINIVIL,ZESTRIL) 10 MG tablet Take 1 tablet (10 mg total) by mouth daily.  Marland Kitchen loratadine (CLARITIN) 10 MG tablet Take 10 mg by mouth daily.  . Multiple Vitamin (MULTIVITAMIN WITH MINERALS) TABS tablet Take 1 tablet by mouth daily.  . rosuvastatin (CRESTOR) 20 MG tablet Take 1 tablet (20 mg total) by mouth daily.  . [DISCONTINUED] doxycycline (VIBRA-TABS) 100 MG tablet Take 1 tablet (100 mg total) by mouth 2 (two) times daily. 1 po bid  . [DISCONTINUED] hydrochlorothiazide (HYDRODIURIL) 25 MG tablet Take 1 tablet (25 mg total) by mouth daily.  . [DISCONTINUED] levothyroxine (SYNTHROID, LEVOTHROID) 25 MCG tablet Take 1 tablet (25 mcg total) by mouth daily.  . [DISCONTINUED] lisinopril (PRINIVIL,ZESTRIL) 10 MG tablet  Take 1 tablet (10 mg total) by mouth daily.  . [DISCONTINUED] rosuvastatin (CRESTOR) 20 MG tablet Take 1 tablet (20 mg total) by mouth daily.   No facility-administered encounter medications on file as of 06/01/2017.      Review of Systems  Constitutional: Negative.   HENT: Negative.   Eyes: Negative.   Respiratory: Negative.   Cardiovascular: Negative.   Gastrointestinal: Negative.   Endocrine: Negative.   Genitourinary: Negative.   Musculoskeletal: Negative.   Skin: Negative.     Allergic/Immunologic: Negative.   Neurological: Negative.   Hematological: Negative.   Psychiatric/Behavioral: Negative.        Objective:   Physical Exam  Constitutional: She is oriented to person, place, and time. She appears well-developed and well-nourished. No distress.  Patient is pleasant and alert and in good spirits.  HENT:  Head: Normocephalic and atraumatic.  Right Ear: External ear normal.  Left Ear: External ear normal.  Mouth/Throat: Oropharynx is clear and moist. No oropharyngeal exudate.  Nasal turbinate congestion bilaterally but patient refuses to use any kind of nasal sprays.  Eyes: Pupils are equal, round, and reactive to light. Conjunctivae and EOM are normal. Right eye exhibits no discharge. Left eye exhibits no discharge.  Neck: Normal range of motion. Neck supple. No thyromegaly present.  No bruits thyromegaly or anterior cervical adenopathy  Cardiovascular: Normal rate, regular rhythm, normal heart sounds and intact distal pulses.  No murmur heard. Heart is regular at 72/min   Pulmonary/Chest: Effort normal and breath sounds normal. She has no wheezes. She has no rales.  Clear anteriorly and posteriorly  Abdominal: Soft. Bowel sounds are normal. She exhibits no mass. There is no tenderness.  Morbid obesity without organ enlargement masses bruits or tenderness  Musculoskeletal: Normal range of motion. She exhibits no edema.  Lymphadenopathy:    She has no cervical adenopathy.  Neurological: She is alert and oriented to person, place, and time. She has normal reflexes. No cranial nerve deficit.  Skin: Skin is warm and dry. No rash noted.  Psychiatric: She has a normal mood and affect. Her behavior is normal. Judgment and thought content normal.  Normal mood behavior and affect for this patient  Nursing note and vitals reviewed.   BP 111/71 (BP Location: Right Arm)   Pulse 77   Temp 97.6 F (36.4 C) (Oral)   Ht '5\' 11"'  (1.803 m)   Wt 283 lb (128.4 kg)    LMP 03/16/2011   BMI 39.47 kg/m        Assessment & Plan:  1. Hypothyroidism, unspecified type -Continue current treatment pending results of lab work - CBC with Differential/Platelet - Thyroid Panel With TSH  2. Vitamin D deficiency -Continue vitamin D replacement pending results of lab work - CBC with Differential/Platelet - VITAMIN D 25 Hydroxy (Vit-D Deficiency, Fractures)  3. Seizure disorder (HCC) -Check Dilantin and Keppra levels and follow-up with neurology as planned - CBC with Differential/Platelet - Phenytoin level, free and total - Levetiracetam level  4. Essential hypertension -The blood pressure is good today and she will continue with current treatment - BMP8+EGFR - CBC with Differential/Platelet - Hepatic function panel  5. Pure hypercholesterolemia -Continue with Crestor and aggressive therapeutic lifestyle changes with all efforts to lose weight through diet and exercise - CBC with Differential/Platelet - Lipid panel  6. BMI 39.0-39.9,adult -Patient is aware of elevated BMI and her morbid obesity issues and will continue to push hard to lose weight through diet and exercise  7. Morbid obesity  due to excess calories (HCC) -Weight is down 4 pounds since the last visit she will continue to make all efforts to lose weight through diet and exercise  8. Seasonal allergic rhinitis due to pollen -Patient has nasal turbinate congestion bilaterally but does not like to use any kind of nasal sprays or medication and she is not bothered with it that much.  9. Generalized convulsive epilepsy ([place]) -Follow-up with neurology as planned  Patient Instructions  Continue current medications. Continue good therapeutic lifestyle changes which include good diet and exercise. Fall precautions discussed with patient. If an FOBT was given today- please return it to our front desk. If you are over 63 years old - you may need Prevnar 86 or the adult Pneumonia  vaccine.  **Flu shots are available--- please call and schedule a FLU-CLINIC appointment**  After your visit with [place] today you will receive a survey in the mail or online from [employer] regarding your care with [place]. Please take a moment to fill this out. Your feedback is very important to [place] as you can help [place] better understand your patient needs as well as improve your experience and satisfaction. WE CARE ABOUT YOU!!!   Keep appointment with neurology  Continue to work on getting your weight (BMI) lower. We will call you when lab results come back   Arrie Senate MD

## 2017-06-01 NOTE — Patient Instructions (Addendum)
Continue current medications. Continue good therapeutic lifestyle changes which include good diet and exercise. Fall precautions discussed with patient. If an FOBT was given today- please return it to our front desk. If you are over 59 years old - you may need Prevnar 13 or the adult Pneumonia vaccine.  **Flu shots are available--- please call and schedule a FLU-CLINIC appointment**  After your visit with [place] today you will receive a survey in the mail or online from [employer] regarding your care with [place]. Please take a moment to fill this out. Your feedback is very important to [place] as you can help [place] better understand your patient needs as well as improve your experience and satisfaction. WE CARE ABOUT YOU!!!   Keep appointment with neurology  Continue to work on getting your weight (BMI) lower. We will call you when lab results come back

## 2017-06-04 LAB — CBC WITH DIFFERENTIAL/PLATELET
BASOS ABS: 0 10*3/uL (ref 0.0–0.2)
BASOS: 0 %
EOS (ABSOLUTE): 0.2 10*3/uL (ref 0.0–0.4)
Eos: 3 %
Hematocrit: 40.6 % (ref 34.0–46.6)
Hemoglobin: 13.2 g/dL (ref 11.1–15.9)
IMMATURE GRANS (ABS): 0 10*3/uL (ref 0.0–0.1)
Immature Granulocytes: 0 %
LYMPHS ABS: 1.9 10*3/uL (ref 0.7–3.1)
Lymphs: 28 %
MCH: 29.3 pg (ref 26.6–33.0)
MCHC: 32.5 g/dL (ref 31.5–35.7)
MCV: 90 fL (ref 79–97)
MONOS ABS: 0.5 10*3/uL (ref 0.1–0.9)
Monocytes: 8 %
Neutrophils Absolute: 4.1 10*3/uL (ref 1.4–7.0)
Neutrophils: 61 %
PLATELETS: 228 10*3/uL (ref 150–379)
RBC: 4.5 x10E6/uL (ref 3.77–5.28)
RDW: 14.3 % (ref 12.3–15.4)
WBC: 6.8 10*3/uL (ref 3.4–10.8)

## 2017-06-04 LAB — BMP8+EGFR
BUN / CREAT RATIO: 29 — AB (ref 9–23)
BUN: 24 mg/dL (ref 6–24)
CALCIUM: 9.9 mg/dL (ref 8.7–10.2)
CHLORIDE: 99 mmol/L (ref 96–106)
CO2: 24 mmol/L (ref 20–29)
Creatinine, Ser: 0.83 mg/dL (ref 0.57–1.00)
GFR, EST AFRICAN AMERICAN: 90 mL/min/{1.73_m2} (ref >59)
GFR, EST NON AFRICAN AMERICAN: 78 mL/min/{1.73_m2} (ref >59)
Glucose: 82 mg/dL (ref 65–99)
Potassium: 4.3 mmol/L (ref 3.5–5.2)
SODIUM: 141 mmol/L (ref 134–144)

## 2017-06-04 LAB — VITAMIN D 25 HYDROXY (VIT D DEFICIENCY, FRACTURES): Vit D, 25-Hydroxy: 45.7 ng/mL (ref 30.0–100.0)

## 2017-06-04 LAB — HEPATIC FUNCTION PANEL
ALT: 25 IU/L (ref 0–32)
AST: 21 IU/L (ref 0–40)
Albumin: 4.4 g/dL (ref 3.5–5.5)
Alkaline Phosphatase: 103 IU/L (ref 39–117)
Bilirubin Total: 0.3 mg/dL (ref 0.0–1.2)
Bilirubin, Direct: 0.13 mg/dL (ref 0.00–0.40)
Total Protein: 7.2 g/dL (ref 6.0–8.5)

## 2017-06-04 LAB — LIPID PANEL
CHOLESTEROL TOTAL: 169 mg/dL (ref 100–199)
Chol/HDL Ratio: 3.7 ratio (ref 0.0–4.4)
HDL: 46 mg/dL (ref >39)
LDL Calculated: 94 mg/dL (ref 0–99)
Triglycerides: 144 mg/dL (ref 0–149)
VLDL CHOLESTEROL CAL: 29 mg/dL (ref 5–40)

## 2017-06-04 LAB — PHENYTOIN LEVEL, FREE AND TOTAL
PHENYTOIN, SERUM: 17.3 ug/mL (ref 10.0–20.0)
Phenytoin, Free: 1.3 ug/mL (ref 1.0–2.0)

## 2017-06-04 LAB — THYROID PANEL WITH TSH
FREE THYROXINE INDEX: 1.7 (ref 1.2–4.9)
T3 Uptake Ratio: 24 % (ref 24–39)
T4 TOTAL: 7.2 ug/dL (ref 4.5–12.0)
TSH: 2.07 u[IU]/mL (ref 0.450–4.500)

## 2017-06-04 LAB — LEVETIRACETAM LEVEL: Levetiracetam Lvl: 11.1 ug/mL (ref 10.0–40.0)

## 2017-06-04 MED FILL — DILANTIN 100 MG CAPSULE: 100 | 30 days supply | Qty: 180 | Fill #5

## 2017-07-02 MED FILL — DILANTIN 100 MG CAPSULE: 100 | 30 days supply | Qty: 180 | Fill #6

## 2017-07-18 ENCOUNTER — Other Ambulatory Visit: Payer: Self-pay | Admitting: Family Medicine

## 2017-07-18 ENCOUNTER — Encounter: Payer: Self-pay | Admitting: Family Medicine

## 2017-07-18 MED ORDER — LEVOTHYROXINE SODIUM 25 MCG PO TABS: 25.0000 ug | ORAL_TABLET | Freq: Every day | ORAL | 0 refills | Status: DC

## 2017-07-18 MED ORDER — LISINOPRIL 10 MG PO TABS: 10.0000 mg | ORAL_TABLET | Freq: Every day | ORAL | 0 refills | Status: DC

## 2017-07-18 MED ORDER — ROSUVASTATIN CALCIUM 20 MG PO TABS: 20.0000 mg | ORAL_TABLET | Freq: Every day | ORAL | 0 refills | Status: DC

## 2017-07-18 MED ORDER — HYDROCHLOROTHIAZIDE 25 MG PO TABS: 25.0000 mg | ORAL_TABLET | Freq: Every day | ORAL | 0 refills | Status: DC

## 2017-07-18 MED FILL — LEVOTHYROXINE 25 MCG TABLET: 25 | 90 days supply | Qty: 90 | Fill #0

## 2017-07-18 MED FILL — ROSUVASTATIN CALCIUM 20 MG: 20 | 90 days supply | Qty: 90 | Fill #0

## 2017-07-18 MED FILL — LISINOPRIL 10 MG TABLET: 10 | 90 days supply | Qty: 90 | Fill #0

## 2017-07-18 MED FILL — HYDROCHLOROTHIAZIDE 25 MG T: 25 | 90 days supply | Qty: 90 | Fill #0

## 2017-07-30 MED FILL — DILANTIN 100 MG CAPSULE: 100 | 30 days supply | Qty: 180 | Fill #7

## 2017-08-20 MED FILL — levETIRAcetam 500 MG TABS: 500 | 90 days supply | Qty: 180 | Fill #1

## 2017-08-27 MED FILL — DILANTIN 100 MG CAPSULE: 100 | 30 days supply | Qty: 180 | Fill #8

## 2017-09-28 MED FILL — DILANTIN 100 MG CAPSULE: 100 | 30 days supply | Qty: 180 | Fill #9

## 2017-10-05 ENCOUNTER — Ambulatory Visit (INDEPENDENT_AMBULATORY_CARE_PROVIDER_SITE_OTHER): Payer: 59 | Admitting: Family Medicine

## 2017-10-05 ENCOUNTER — Ambulatory Visit (INDEPENDENT_AMBULATORY_CARE_PROVIDER_SITE_OTHER): Payer: 59

## 2017-10-05 ENCOUNTER — Encounter: Payer: Self-pay | Admitting: Family Medicine

## 2017-10-05 VITALS — BP 124/77 | HR 82 | Temp 97.5°F | Ht 71.0 in | Wt 291.0 lb

## 2017-10-05 DIAGNOSIS — G8929 Other chronic pain: Secondary | ICD-10-CM

## 2017-10-05 DIAGNOSIS — E7800 Pure hypercholesterolemia, unspecified: Secondary | ICD-10-CM

## 2017-10-05 DIAGNOSIS — G40909 Epilepsy, unspecified, not intractable, without status epilepticus: Secondary | ICD-10-CM

## 2017-10-05 DIAGNOSIS — M25561 Pain in right knee: Secondary | ICD-10-CM

## 2017-10-05 DIAGNOSIS — I10 Essential (primary) hypertension: Secondary | ICD-10-CM

## 2017-10-05 DIAGNOSIS — E559 Vitamin D deficiency, unspecified: Secondary | ICD-10-CM

## 2017-10-05 DIAGNOSIS — M1711 Unilateral primary osteoarthritis, right knee: Secondary | ICD-10-CM | POA: Diagnosis not present

## 2017-10-05 DIAGNOSIS — E039 Hypothyroidism, unspecified: Secondary | ICD-10-CM | POA: Diagnosis not present

## 2017-10-05 MED ORDER — ROSUVASTATIN CALCIUM 20 MG PO TABS: 20.0000 mg | ORAL_TABLET | Freq: Every day | ORAL | 3 refills | Status: DC

## 2017-10-05 MED ORDER — LISINOPRIL 10 MG PO TABS: 10.0000 mg | ORAL_TABLET | Freq: Every day | ORAL | 3 refills | Status: DC

## 2017-10-05 MED ORDER — LEVOTHYROXINE SODIUM 25 MCG PO TABS: 25.0000 ug | ORAL_TABLET | Freq: Every day | ORAL | 3 refills | Status: DC

## 2017-10-05 MED ORDER — HYDROCHLOROTHIAZIDE 25 MG PO TABS: 25.0000 mg | ORAL_TABLET | Freq: Every day | ORAL | 3 refills | Status: DC

## 2017-10-05 MED FILL — ROSUVASTATIN CALCIUM 20 MG: 20 | 90 days supply | Qty: 90 | Fill #0

## 2017-10-05 MED FILL — HYDROCHLOROTHIAZIDE 25 MG T: 25 | 90 days supply | Qty: 90 | Fill #0

## 2017-10-05 MED FILL — LEVOTHYROXINE 25 MCG TABLET: 25 | 90 days supply | Qty: 90 | Fill #0

## 2017-10-05 MED FILL — LISINOPRIL 10 MG TABLET: 10 | 90 days supply | Qty: 90 | Fill #0

## 2017-10-05 NOTE — Patient Instructions (Addendum)
Continue current medications. Continue good therapeutic lifestyle changes which include good diet and exercise. Fall precautions discussed with patient. If an FOBT was given today- please return it to our front desk. If you are over 59 years old - you may need Prevnar 13 or the adult Pneumonia vaccine.  **Flu shots are available--- please call and schedule a FLU-CLINIC appointment**  After your visit with us today you will receive a survey in the mail or online from [employer] regarding your care with us. Please take a moment to fill this out. Your feedback is very important to us as you can help us better understand your patient needs as well as improve your experience and satisfaction. WE CARE ABOUT YOU!!!   Do not forget to get your flu shot at work Continue to follow-up with neurology as planned to get your prescriptions filled and we will make sure that Dr. Anne HahnWillis gets a copy of the blood levels of Keppra and Dilantin. Continue to drink plenty of fluids and make all efforts to lose weight through diet and exercise We will arrange for you to see the orthopedic surgeon next Thursday morning regarding the narrow joint space in the right knee.

## 2017-10-05 NOTE — Progress Notes (Signed)
Subjective:    Patient ID: Maria Rivers, female    DOB: March 05, 1960, 57 y.o.   MRN: [id number]  HPI Pt here for follow up and management of chronic medical problems which includes hypothyroid and hypertension. She is taking medication regularly.  Patient is doing well overall but complains of right knee pain.  She will get her flu shot at work she will check with her insurance regarding her Prevnar vaccine.  Her vital signs are stable and unfortunately her weight has increased from 2 83-2 91.  She has a BMI of 39.49 and has morbid obesity based upon hypertension epilepsy hyperlipidemia and hypothyroidism.  She denies any chest pain pressure or shortness of breath.  She denies any trouble with her stomach including nausea vomiting diarrhea or blood in the stool.  She is passing her water without problems.  She has had the right knee pain for quite a while and has given her trouble off and on.  She has not had any injury recently but is started bothering her again a lot more than usual and is especially bad at sometimes with walking and catches her quickly.  The patient continues to refuse to get Pap smears mammograms DEXA scan's chest x-rays and to do fecal occult blood testing.  We will get an x-ray of the knee and make a decision about whether we need to get her see the orthopedist for injection are not after we look at the x-ray.  She has not had any seizure activity and her seizure meds are regulated by the neurologist.    Patient Active Problem List   Diagnosis Date Noted  . Encounter for therapeutic drug monitoring 05/23/2016  . Metabolic syndrome 13/24/4010  . Hyperlipidemia 11/28/2012  . Hypothyroidism 11/28/2012  . Hypertension 11/28/2012  . 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 Encounter for long-term (current) use of other medications 11/14/2012   Outpatient Encounter Medications as of 10/05/2017  Medication Sig  . ALPRAZolam (XANAX) 0.25 MG tablet Take one half to one  tablet at bedtime as needed  . cholecalciferol (VITAMIN D) 1000 UNITS tablet Take 2,000 Units by mouth daily.   Marland Kitchen DILANTIN 100 MG ER capsule Take 3 capsules (300 mg total) by mouth 2 (two) times daily.  . hydrochlorothiazide (HYDRODIURIL) 25 MG tablet Take 1 tablet (25 mg total) by mouth daily.  Marland Kitchen ibuprofen (ADVIL,MOTRIN) 200 MG tablet Take 200 mg by mouth as needed. For pain  . levETIRAcetam (KEPPRA) 500 MG tablet Take 1 tablet (500 mg total) by mouth 2 (two) times daily. Take 1/2 tablet in the AM and 1.5 tablets in the PM  . levothyroxine (SYNTHROID, LEVOTHROID) 25 MCG tablet Take 1 tablet (25 mcg total) by mouth daily.  Marland Kitchen lisinopril (PRINIVIL,ZESTRIL) 10 MG tablet Take 1 tablet (10 mg total) by mouth daily.  Marland Kitchen loratadine (CLARITIN) 10 MG tablet Take 10 mg by mouth daily.  . Multiple Vitamin (MULTIVITAMIN WITH MINERALS) TABS tablet Take 1 tablet by mouth daily.  . rosuvastatin (CRESTOR) 20 MG tablet Take 1 tablet (20 mg total) by mouth daily.   No facility-administered encounter medications on file as of 10/05/2017.       Review of Systems  Constitutional: Negative.   HENT: Negative.   Eyes: Negative.   Respiratory: Negative.   Cardiovascular: Negative.   Gastrointestinal: Negative.   Endocrine: Negative.   Genitourinary: Negative.   Musculoskeletal: Positive for arthralgias (right knee pain ).  Skin: Negative.   Allergic/Immunologic: Negative.   Neurological: Negative.  Hematological: Negative.   Psychiatric/Behavioral: Negative.        Objective:   Physical Exam  Constitutional: She is oriented to person, place, and time. She appears well-developed and well-nourished. No distress.  Patient is calm pleasant and alert and has no complaints other than the right lateral knee joint.  HENT:  Head: Normocephalic and atraumatic.  Right Ear: External ear normal.  Left Ear: External ear normal.  Nose: Nose normal.  Mouth/Throat: Oropharynx is clear and moist.  Eyes: Pupils are  equal, round, and reactive to light. Conjunctivae and EOM are normal. Right eye exhibits no discharge. Left eye exhibits no discharge. No scleral icterus.  Neck: Normal range of motion. Neck supple. No thyromegaly present.  No bruits thyromegaly or anterior cervical adenopathy  Cardiovascular: Normal rate, regular rhythm and normal heart sounds.  No murmur heard. The heart is regular at 72/min  Pulmonary/Chest: Effort normal and breath sounds normal. She has no wheezes. She has no rales.  Clear anteriorly and posteriorly  Abdominal: Soft. Bowel sounds are normal. She exhibits no mass. There is no tenderness. There is no rebound and no guarding.  Abdominal obesity without masses tenderness organ enlargement bruits or suprapubic tenderness.  Musculoskeletal: She exhibits tenderness. She exhibits no edema.  There is some difficulty with getting up from a supine position and starting to walk on the right leg because of the knee pain.  The patient was tender at the right lateral joint line of the knee on the right side.  There was no fever or redness and compared to the left knee did not appear to be any different in size or swelling.  There is no pedal edema on either side.  It was difficult for the patient to straighten the knee out with laying on the table.  Lymphadenopathy:    She has no cervical adenopathy.  Neurological: She is alert and oriented to person, place, and time. She has normal reflexes. No cranial nerve deficit.  Skin: Skin is warm and dry. No rash noted.  Psychiatric: She has a normal mood and affect. Her behavior is normal. Judgment and thought content normal.  Mood affect and behavior were all normal.  Nursing note and vitals reviewed.  BP 124/77 (BP Location: Left Arm)   Pulse 82   Temp (!) 97.5 F (36.4 C) (Oral)   Ht '5\' 11"'  (1.803 m)   Wt 291 lb (132 kg)   LMP 03/16/2011   BMI 40.59 kg/m   Xrays of right knee show bone on bone with severe joint space narrowing.  There  is actually joint space narrowing on the left knee but not as much on the right knee.  The patient and her husband both saw the films and will agree to come back to see the orthopedic surgeon next Thursday morning.      Assessment & Plan:  1. Vitamin D deficiency -Continue vitamin D replacement pending results of lab work - CBC with Differential/Platelet - VITAMIN D 25 Hydroxy (Vit-D Deficiency, Fractures)  2. Hypothyroidism, unspecified type -Continue thyroid replacement pending results of lab work - Thyroid Panel With TSH - CBC with Differential/Platelet  3. Seizure disorder ([place]) -Continue to follow-up with neurology and continue current doses of Keppra and Dilantin pending results of lab work - Levetiracetam level - Phenytoin level, free and total - CBC with Differential/Platelet  4. Essential hypertension -The blood pressure is good she will continue with current treatment - BMP8+EGFR - CBC with Differential/Platelet - Hepatic function panel  5. Pure hypercholesterolemia -Continue with statin treatment and aggressive therapeutic lifestyle changes geared toward losing weight - CBC with Differential/Platelet - Lipid panel  6. 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  Patient Instructions  Continue current medications. Continue good therapeutic lifestyle changes which include good diet and exercise. Fall precautions discussed with patient. If an FOBT was given today- please return it to our front desk. If you are over 68 years old - you may need Prevnar 35 or the adult Pneumonia vaccine.  **Flu shots are available--- please call and schedule a FLU-CLINIC appointment**  After your visit with [place] today you will receive a survey in the mail or online  from [employer] regarding your care with [place]. Please take a moment to fill this out. Your feedback is very important to [place] as you can help [place] better understand your patient needs as well as improve your experience and satisfaction. WE CARE ABOUT YOU!!!   Do not forget to get your flu shot at work Continue to follow-up with neurology as planned to get your prescriptions filled and we will make sure that Dr. Jannifer Franklin gets a copy of the blood levels of Keppra and Dilantin. Continue to drink plenty of fluids and make all efforts to lose weight through diet and exercise We will arrange for you to see the orthopedic surgeon next Thursday morning regarding the narrow joint space in the right knee.  Arrie Senate MD

## 2017-10-07 ENCOUNTER — Encounter: Payer: Self-pay | Admitting: Family Medicine

## 2017-10-08 LAB — CBC WITH DIFFERENTIAL/PLATELET
Basophils Absolute: 0 10*3/uL (ref 0.0–0.2)
Basos: 0 %
EOS (ABSOLUTE): 0.3 10*3/uL (ref 0.0–0.4)
Eos: 3 %
Hematocrit: 38.8 % (ref 34.0–46.6)
Hemoglobin: 12.8 g/dL (ref 11.1–15.9)
Immature Grans (Abs): 0 10*3/uL (ref 0.0–0.1)
Immature Granulocytes: 0 %
Lymphocytes Absolute: 2.3 10*3/uL (ref 0.7–3.1)
Lymphs: 26 %
MCH: 29.4 pg (ref 26.6–33.0)
MCHC: 33 g/dL (ref 31.5–35.7)
MCV: 89 fL (ref 79–97)
Monocytes Absolute: 0.6 10*3/uL (ref 0.1–0.9)
Monocytes: 7 %
Neutrophils Absolute: 5.5 10*3/uL (ref 1.4–7.0)
Neutrophils: 64 %
Platelets: 230 10*3/uL (ref 150–450)
RBC: 4.35 x10E6/uL (ref 3.77–5.28)
RDW: 14.3 % (ref 12.3–15.4)
WBC: 8.7 10*3/uL (ref 3.4–10.8)

## 2017-10-08 LAB — BMP8+EGFR
BUN/Creatinine Ratio: 32 — ABNORMAL HIGH (ref 9–23)
BUN: 26 mg/dL — ABNORMAL HIGH (ref 6–24)
CO2: 24 mmol/L (ref 20–29)
Calcium: 9.4 mg/dL (ref 8.7–10.2)
Chloride: 98 mmol/L (ref 96–106)
Creatinine, Ser: 0.82 mg/dL (ref 0.57–1.00)
GFR calc Af Amer: 91 mL/min/{1.73_m2}
GFR calc non Af Amer: 79 mL/min/{1.73_m2}
Glucose: 86 mg/dL (ref 65–99)
Potassium: 4 mmol/L (ref 3.5–5.2)
Sodium: 137 mmol/L (ref 134–144)

## 2017-10-08 LAB — PHENYTOIN LEVEL, FREE AND TOTAL
Phenytoin, Free: 1.4 ug/mL (ref 1.0–2.0)
Phenytoin, Serum: 18.2 ug/mL (ref 10.0–20.0)

## 2017-10-08 LAB — THYROID PANEL WITH TSH
FREE THYROXINE INDEX: 1.5 (ref 1.2–4.9)
T3 UPTAKE RATIO: 23 % — AB (ref 24–39)
T4 TOTAL: 6.7 ug/dL (ref 4.5–12.0)
TSH: 2.34 u[IU]/mL (ref 0.450–4.500)

## 2017-10-08 LAB — HEPATIC FUNCTION PANEL
ALT: 19 [IU]/L (ref 0–32)
AST: 17 [IU]/L (ref 0–40)
Albumin: 4.2 g/dL (ref 3.5–5.5)
Alkaline Phosphatase: 102 [IU]/L (ref 39–117)
Bilirubin Total: 0.2 mg/dL (ref 0.0–1.2)
Bilirubin, Direct: 0.09 mg/dL (ref 0.00–0.40)
Total Protein: 6.7 g/dL (ref 6.0–8.5)

## 2017-10-08 LAB — LEVETIRACETAM LEVEL: Levetiracetam Lvl: 8.2 ug/mL — ABNORMAL LOW (ref 10.0–40.0)

## 2017-10-08 LAB — LIPID PANEL
Chol/HDL Ratio: 3 ratio (ref 0.0–4.4)
Cholesterol, Total: 151 mg/dL (ref 100–199)
HDL: 51 mg/dL
LDL Calculated: 79 mg/dL (ref 0–99)
Triglycerides: 104 mg/dL (ref 0–149)
VLDL Cholesterol Cal: 21 mg/dL (ref 5–40)

## 2017-10-08 LAB — VITAMIN D 25 HYDROXY (VIT D DEFICIENCY, FRACTURES): Vit D, 25-Hydroxy: 39.8 ng/mL (ref 30.0–100.0)

## 2017-10-11 ENCOUNTER — Other Ambulatory Visit: Payer: Self-pay

## 2017-10-11 ENCOUNTER — Ambulatory Visit: Payer: 59 | Admitting: Nurse Practitioner

## 2017-10-11 ENCOUNTER — Encounter: Payer: Self-pay | Admitting: Family Medicine

## 2017-10-11 DIAGNOSIS — M25561 Pain in right knee: Secondary | ICD-10-CM

## 2017-10-17 NOTE — Progress Notes (Signed)
[facility]  PATIENT: Maria Rivers DOB: 06/20/1960   REASON FOR VISIT: Follow-up for generalized epilepsy  HISTORY FROM: History from patient     HISTORY OF PRESENT ILLNESS:UPDATE 10/3/2019CM Maria Rivers, 57 year old female returns for follow-up with history of generalized seizure disorder.  She is currently on Dilantin 300 twice a day and Keppra 250 in the morning and 750 at night.  Last seizure occurred in May 2018.  Recent labs drawn at primary care, CBC BMP within normal limits.  Dilantin level 18.2, free level 1.4.  Keppra level 8.2.  She is having some right knee problems and is due to see orthopedic.  She returns for reevaluation  UPDATE 1/24/2019CM Maria Rivers, 57 year old female returns for follow-up with history of generalized seizure disorder.  Last seizure occurred in May 2018.  She is currently on Dilantin 300 mg twice a day.  She is on Keppra 500 mg half a tablet in the morning and 1.5 tablets in the p.m.Marland Kitchen  She denies side effects to the medication.  Recent labs drawn in primary care office 01/05/2017 with Keppra level 14.1, Dilantin level 18.7.  CBC within normal limits BMP within normal limits.  She returns for reevaluation   UPDATE 07/23/2018CM Maria Rivers, 57 year old female returns for follow-up. When last seen in May she  had 6 seizures over the weekend she had been compliant with her Dilantin which was therapeutic. Keppra was added to her drug regimen. She has not had further seizure events since that time. She denies any side effects to the Keppra. She is not driving and she cannot drive for 6 months from her recent seizure activity. She returns for reevaluation   UPDATE 05/08/2018CM Maria Rivers, 57 year old female returns for follow-up with her husband. She has a history of seizure disorder. Most recent Dilantin level at primary care's office on 04/25/2016 was 18.9. She is on brand Dilantin taking 3 capsules twice a day. She denies missing any doses. On Friday of  last week she had 3 seizure episodes about an hour apart, one occurred in her sleep , on Saturday she had 2 additional seizure events at home Sunday she had one in church. Husband witnessed some of her events where she was trembling on one side of body and not aware. She did not bite her tongue. There was no loss of bowel or bladder control. She was confused for several hours afterwards. Prior to this her last seizure was July 2012 and that was a brief staring episode. She denies any signs and symptoms of infection. She does say she has lost 30 pounds in the last 3 months. She returns for reevaluation   UPDATE 12/27/15 Maria Rivers, 57 year old returns for yearly follow up.  She has a history of seizures. Last seizure activity was a brief staring episode in July 2012, and the Dilantin level was found to be low in the 6.0 range. The patient was increased on the Dilantin. She had repeat level done 10/08/2013, in Dr. Kathi Der office, level was 25.6. Dose was reduced to 600 mg of Dilantin daily (BRAND). The patient has done well on this dose, and denies any side effects such as drowsiness or gait instability. CBC and liver function done in Dr. Kathi Der office 12/21/15  were within normal limits. Dilantin level 16.6.Patient returns for reevaluationand refills    REVIEW OF SYSTEMS: Full 14 system review of systems performed and notable only for those listed, all others are neg:  Constitutional: neg  Cardiovascular: neg Ear/Nose/Throat:  neg  Skin: neg Eyes: neg Respiratory: neg Gastroitestinal: neg  Hematology/Lymphatic: neg  Endocrine: neg Musculoskeletal: Right knee pain, walking difficulty Allergy/Immunology: neg Neurological: History of generalized seizure activity Psychiatric: neg Sleep : neg   ALLERGIES: Allergies  Allergen Reactions  . Mushroom Extract Complex Swelling  . Penicillins     UNKNOWN REACTION  . Shellfish Allergy Swelling  . Tamiflu [Oseltamivir Phosphate] Rash    Severe  rash, hives    HOME MEDICATIONS: Outpatient Medications Prior to Visit  Medication Sig Dispense Refill  . ALPRAZolam (XANAX) 0.25 MG tablet Take one half to one tablet at bedtime as needed 30 tablet 0  . cholecalciferol (VITAMIN D) 1000 UNITS tablet Take 2,000 Units by mouth daily.     Marland Kitchen DILANTIN 100 MG ER capsule Take 3 capsules (300 mg total) by mouth 2 (two) times daily. 180 capsule 11  . hydrochlorothiazide (HYDRODIURIL) 25 MG tablet Take 1 tablet (25 mg total) by mouth daily. 90 tablet 3  . ibuprofen (ADVIL,MOTRIN) 200 MG tablet Take 200 mg by mouth 2 (two) times daily. For pain    . levETIRAcetam (KEPPRA) 500 MG tablet Take 1 tablet (500 mg total) by mouth 2 (two) times daily. Take 1/2 tablet in the AM and 1.5 tablets in the PM (Patient taking differently: Take 500 mg by mouth 2 (two) times daily. Take 1 tablet in the AM and 1.5 tablets in the PM) 180 tablet 3  . levothyroxine (SYNTHROID, LEVOTHROID) 25 MCG tablet Take 1 tablet (25 mcg total) by mouth daily. 90 tablet 3  . lisinopril (PRINIVIL,ZESTRIL) 10 MG tablet Take 1 tablet (10 mg total) by mouth daily. 90 tablet 3  . loratadine (CLARITIN) 10 MG tablet Take 10 mg by mouth daily.    . Multiple Vitamin (MULTIVITAMIN WITH MINERALS) TABS tablet Take 1 tablet by mouth daily.    . rosuvastatin (CRESTOR) 20 MG tablet Take 1 tablet (20 mg total) by mouth daily. 90 tablet 3   No facility-administered medications prior to visit.     PAST MEDICAL HISTORY: Past Medical History:  Diagnosis Date  . Hyperlipidemia   . Hypertension   . Seizures (HCC)     PAST SURGICAL HISTORY: History reviewed. No pertinent surgical history.  FAMILY HISTORY: Family History  Problem Relation Age of Onset  . Diabetes Other     SOCIAL HISTORY: Social History   Socioeconomic History  . Marital status: Married    Spouse name: Not on file  . Number of children: Not on file  . Years of education: Not on file  . Highest education level: Not on file    Occupational History    Comment: RN [employer]  Social Needs  . Financial resource strain: Not on file  . Food insecurity:    Worry: Not on file    Inability: Not on file  . Transportation needs:    Medical: Not on file    Non-medical: Not on file  Tobacco Use  . Smoking status: Never Smoker  . Smokeless tobacco: Never Used  Substance and Sexual Activity  . Alcohol use: No  . Drug use: No  . Sexual activity: Yes    Birth control/protection: None  Lifestyle  . Physical activity:    Days per week: Not on file    Minutes per session: Not on file  . Stress: Not on file  Relationships  . Social connections:    Talks on phone: Not on file    Gets together: Not on file  Attends religious service: Not on file    Active member of club or organization: Not on file    Attends meetings of clubs or organizations: Not on file    Relationship status: Not on file  . Intimate partner violence:    Fear of current or ex partner: Not on file    Emotionally abused: Not on file    Physically abused: Not on file    Forced sexual activity: Not on file  Other Topics Concern  . Not on file  Social History Narrative  . Not on file     PHYSICAL EXAM  Vitals:   10/18/17 1520  BP: 104/62  Pulse: 83  SpO2: 98%  Weight: 288 lb (130.6 kg)  Height: 5\' 11"  (1.803 m)   Body mass index is 40.17 kg/m. Generalized: Well developed, morbidly obese female in no acute distress   Neurological examination   Mentation: Alert oriented to time, place, history taking. Follows all commands speech and language fluent  Cranial nerve II-XII: Pupils were equal round reactive to light extraocular movements were full, visual field were full on confrontational test. Facial sensation and strength were normal. hearing was intact to finger rubbing bilaterally. Uvula tongue midline. head turning and shoulder shrug and were normal and symmetric.Tongue protrusion into cheek strength was normal. Motor: normal bulk  and tone, full strength in the BUE, BLE,  Coordination: finger-nose-finger, no dysmetria.  Reflexes: Depressed and symmetric upper and lower Gait and Station: Rising up from seated position without assistance, normal stance, moderate stride, good arm swing, smooth turning, un able to perform tiptoe, and heel walking without difficulty due to right knee pain.    DIAGNOSTIC DATA (LABS, IMAGING, TESTING) - I reviewed patient records, labs, notes, testing and imaging myself where available.  Lab Results  Component Value Date   WBC 8.7 10/05/2017   HGB 12.8 10/05/2017   HCT 38.8 10/05/2017   MCV 89 10/05/2017   PLT 230 10/05/2017      Component Value Date/Time   NA 137 10/05/2017 1621   K 4.0 10/05/2017 1621   CL 98 10/05/2017 1621   CO2 24 10/05/2017 1621   GLUCOSE 86 10/05/2017 1621   BUN 26 (H) 10/05/2017 1621   CREATININE 0.82 10/05/2017 1621   CALCIUM 9.4 10/05/2017 1621   PROT 6.7 10/05/2017 1621   ALBUMIN 4.2 10/05/2017 1621   AST 17 10/05/2017 1621   ALT 19 10/05/2017 1621   ALKPHOS 102 10/05/2017 1621   BILITOT 0.2 10/05/2017 1621   GFRNONAA 79 10/05/2017 1621   GFRAA 91 10/05/2017 1621   Lab Results  Component Value Date   CHOL 151 10/05/2017   HDL 51 10/05/2017   LDLCALC 79 10/05/2017   TRIG 104 10/05/2017   CHOLHDL 3.0 10/05/2017    Lab Results  Component Value Date   TSH 2.340 10/05/2017      ASSESSMENT AND PLAN  56 y.o. year old female  has a past medical history of Hyperlipidemia, Hypertension, and Seizures (HCC). here to follow-up.She had a total of 6 seizures  In May 2018.  She is well controlled on Dilantin and Keppra.  PLAN:   Continue Dilantin at current dose will refill Last Dilantin level 18.2 free level 1.4 on 10/05/17 Continue Keppra 500 mg 1 in the am and 1.5 in the pm will refill Last Keppra level 8.2 on 10/05/17 Reviewed CBC BMP   WNL Call for any further seizure activity    F/U in 1 yr  [name],  [facility],  APRN  [facility] [address] [place], [place] [address] [phone]

## 2017-10-18 ENCOUNTER — Ambulatory Visit: Payer: 59 | Admitting: Nurse Practitioner

## 2017-10-18 ENCOUNTER — Encounter: Payer: Self-pay | Admitting: Nurse Practitioner

## 2017-10-18 VITALS — BP 104/62 | HR 83 | Ht 71.0 in | Wt 288.0 lb

## 2017-10-18 DIAGNOSIS — G40309 Generalized idiopathic epilepsy and epileptic syndromes, not intractable, without status epilepticus: Secondary | ICD-10-CM

## 2017-10-18 MED ORDER — LEVETIRACETAM 500 MG PO TABS: ORAL_TABLET | ORAL | 3 refills | Status: DC

## 2017-10-18 MED ORDER — DILANTIN 100 MG PO CAPS: 300.0000 mg | ORAL_CAPSULE | Freq: Two times a day (BID) | ORAL | 11 refills | Status: DC

## 2017-10-18 NOTE — Patient Instructions (Signed)
Continue Dilantin at current dose will refill Last Dilantin level 18.2 free level 1.4 on 10/05/17 Continue Keppra 500 mg 1 in the am and 1.5 in the pm will refill Last Keppra level 8.2 on 10/05/17 Reviewed CBC BMP   WNL Call for any further seizure activity    F/U in 1 yr

## 2017-10-18 NOTE — Progress Notes (Signed)
Fax confirmation received [facility] [phone] dilantin. sy

## 2017-10-18 NOTE — Progress Notes (Signed)
I have read the note, and I agree with the clinical assessment and plan.  Maria Rivers K Artia Singley   

## 2017-10-22 ENCOUNTER — Encounter: Payer: Self-pay | Admitting: Nurse Practitioner

## 2017-10-22 MED FILL — DILANTIN 100 MG CAPSULE: 100 | 30 days supply | Qty: 180 | Fill #0

## 2017-10-22 MED FILL — levETIRAcetam 500 MG TABS: 500 | 90 days supply | Qty: 225 | Fill #0

## 2017-10-23 MED ORDER — LEVETIRACETAM 500 MG PO TABS: ORAL_TABLET | ORAL | 3 refills | Status: DC

## 2017-10-23 NOTE — Addendum Note (Signed)
Addended by: Guy Begin on: 10/23/2017 08:10 AM   Modules accepted: Orders

## 2017-11-02 ENCOUNTER — Encounter: Payer: Self-pay | Admitting: Family Medicine

## 2017-11-08 DIAGNOSIS — M1711 Unilateral primary osteoarthritis, right knee: Secondary | ICD-10-CM | POA: Diagnosis not present

## 2017-11-08 DIAGNOSIS — M25561 Pain in right knee: Secondary | ICD-10-CM | POA: Diagnosis not present

## 2017-11-08 DIAGNOSIS — Z6841 Body Mass Index (BMI) 40.0 and over, adult: Secondary | ICD-10-CM | POA: Diagnosis not present

## 2017-11-13 ENCOUNTER — Encounter: Payer: Self-pay | Admitting: Family Medicine

## 2017-11-21 ENCOUNTER — Encounter: Payer: Self-pay | Admitting: Family Medicine

## 2017-11-21 ENCOUNTER — Other Ambulatory Visit: Payer: Self-pay | Admitting: *Deleted

## 2017-11-21 MED ORDER — AZITHROMYCIN 250 MG PO TABS: ORAL_TABLET | ORAL | 0 refills | Status: DC

## 2017-11-26 MED FILL — DILANTIN 100 MG CAPSULE: 100 | 30 days supply | Qty: 180 | Fill #1

## 2017-12-17 DIAGNOSIS — H5203 Hypermetropia, bilateral: Secondary | ICD-10-CM | POA: Diagnosis not present

## 2017-12-17 DIAGNOSIS — H52223 Regular astigmatism, bilateral: Secondary | ICD-10-CM | POA: Diagnosis not present

## 2017-12-17 DIAGNOSIS — H524 Presbyopia: Secondary | ICD-10-CM | POA: Diagnosis not present

## 2017-12-31 MED FILL — LEVOTHYROXINE 25 MCG TABLET: 25 | 90 days supply | Qty: 90 | Fill #1

## 2017-12-31 MED FILL — DILANTIN 100 MG CAPSULE: 100 | 30 days supply | Qty: 180 | Fill #2

## 2017-12-31 MED FILL — HYDROCHLOROTHIAZIDE 25 MG T: 25 | 90 days supply | Qty: 90 | Fill #1

## 2017-12-31 MED FILL — LISINOPRIL 10 MG TABS: 10 | 90 days supply | Qty: 90 | Fill #1

## 2017-12-31 MED FILL — ROSUVASTATIN CALCIUM 20 MG: 20 | 90 days supply | Qty: 90 | Fill #1

## 2018-01-21 ENCOUNTER — Encounter: Payer: Self-pay | Admitting: Family Medicine

## 2018-01-22 ENCOUNTER — Encounter: Payer: Self-pay | Admitting: *Deleted

## 2018-01-28 MED FILL — DILANTIN 100 MG CAPSULE: 100 | 30 days supply | Qty: 180 | Fill #3

## 2018-01-28 MED FILL — levETIRAcetam 500 MG TABS: 500 | 90 days supply | Qty: 225 | Fill #1

## 2018-02-07 ENCOUNTER — Ambulatory Visit: Payer: 59 | Admitting: Family Medicine

## 2018-02-07 ENCOUNTER — Encounter: Payer: Self-pay | Admitting: Family Medicine

## 2018-02-07 VITALS — BP 110/72 | HR 82 | Temp 97.0°F | Ht 71.0 in | Wt 279.0 lb

## 2018-02-07 DIAGNOSIS — E559 Vitamin D deficiency, unspecified: Secondary | ICD-10-CM | POA: Diagnosis not present

## 2018-02-07 DIAGNOSIS — E7800 Pure hypercholesterolemia, unspecified: Secondary | ICD-10-CM

## 2018-02-07 DIAGNOSIS — I10 Essential (primary) hypertension: Secondary | ICD-10-CM | POA: Diagnosis not present

## 2018-02-07 DIAGNOSIS — E039 Hypothyroidism, unspecified: Secondary | ICD-10-CM | POA: Diagnosis not present

## 2018-02-07 DIAGNOSIS — G40909 Epilepsy, unspecified, not intractable, without status epilepticus: Secondary | ICD-10-CM | POA: Diagnosis not present

## 2018-02-07 DIAGNOSIS — G40309 Generalized idiopathic epilepsy and epileptic syndromes, not intractable, without status epilepticus: Secondary | ICD-10-CM

## 2018-02-07 NOTE — Progress Notes (Signed)
Subjective:    Patient ID: Maria Rivers, female    DOB: [birth year], 58 y.o.   MRN: [id number]  HPI Pt here for follow up and management of chronic medical problems which includes hypothyroid and hypertension. She is taking medication regularly.  Maria Rivers is doing well overall.  Her vital signs are stable and since she has joined [employer] she is lost 9 pounds.  She sees the neurologist regularly and continues to take her Keppra and Dilantin.  She is taking Crestor for her cholesterol and continues to take vitamin D3 2000 units daily.  She is followed by Dr. Jannifer Rivers regularly and we do her blood levels for her medicine for her seizures.  She continues to refuse to do Pap smear mammogram DEXA scan chest x-ray and FOBT.  She has to have brand name Dilantin because one time with the generic she did have seizures with that.  She has no specific complaints today and is not requesting any medication refills.  The patient denies any chest pain pressure tightness shortness of breath.  She denies any trouble with her intestinal system including swallowing heartburn nausea vomiting diarrhea blood in the stool or black tarry bowel movements.  She is passing her water well.  He has had no seizures.  She sees the neurologist again and August.   Patient Active Problem List   Diagnosis Date Noted  . Encounter for therapeutic drug monitoring 05/23/2016  . Metabolic syndrome 42/35/3614  . Hyperlipidemia 11/28/2012  . Hypothyroidism 11/28/2012  . Hypertension 11/28/2012  . 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 Encounter for long-term (current) use of other medications 11/14/2012   Outpatient Encounter Medications as of 02/07/2018  Medication Sig  . ALPRAZolam (XANAX) 0.25 MG tablet Take one half to one tablet at bedtime as needed  . cholecalciferol (VITAMIN D) 1000 UNITS tablet Take 2,000 Units by mouth daily.   Marland Kitchen DILANTIN 100 MG ER capsule Take 3 capsules (300 mg total) by mouth 2 (two) times  daily.  . hydrochlorothiazide (HYDRODIURIL) 25 MG tablet Take 1 tablet (25 mg total) by mouth daily.  Marland Kitchen ibuprofen (ADVIL,MOTRIN) 200 MG tablet Take 200 mg by mouth 2 (two) times daily. For pain  . levETIRAcetam (KEPPRA) 500 MG tablet Take 1/2 tablet in the AM and 2 tablets in the PM  . levothyroxine (SYNTHROID, LEVOTHROID) 25 MCG tablet Take 1 tablet (25 mcg total) by mouth daily.  Marland Kitchen lisinopril (PRINIVIL,ZESTRIL) 10 MG tablet Take 1 tablet (10 mg total) by mouth daily.  Marland Kitchen loratadine (CLARITIN) 10 MG tablet Take 10 mg by mouth daily.  . Multiple Vitamin (MULTIVITAMIN WITH MINERALS) TABS tablet Take 1 tablet by mouth daily.  . rosuvastatin (CRESTOR) 20 MG tablet Take 1 tablet (20 mg total) by mouth daily.  . [DISCONTINUED] azithromycin (ZITHROMAX) 250 MG tablet As directed   No facility-administered encounter medications on file as of 02/07/2018.       Review of Systems  Constitutional: Negative.   HENT: Negative.   Eyes: Negative.   Respiratory: Negative.   Cardiovascular: Negative.   Gastrointestinal: Negative.   Endocrine: Negative.   Genitourinary: Negative.   Musculoskeletal: Negative.   Skin: Negative.   Allergic/Immunologic: Negative.   Neurological: Negative.   Hematological: Negative.   Psychiatric/Behavioral: Negative.        Objective:   Physical Exam Vitals signs and nursing note reviewed.  Constitutional:      Appearance: Normal appearance. She is well-developed. She is obese. She is not ill-appearing.  Comments: Patient is pleasant and alert with no complaints as usual.  She is proud that she has been able to lose the weight with weight watchers online.  HENT:     Head: Normocephalic and atraumatic.     Right Ear: Tympanic membrane, ear canal and external ear normal. There is no impacted cerumen.     Left Ear: Tympanic membrane, ear canal and external ear normal. There is no impacted cerumen.     Nose: Congestion present. No rhinorrhea.     Comments: Nasal  turbinate congestion and swelling bilaterally but says it does not bother her.    Mouth/Throat:     Mouth: Mucous membranes are moist.     Pharynx: Oropharynx is clear. No oropharyngeal exudate or posterior oropharyngeal erythema.  Eyes:     General: No scleral icterus.       Right eye: No discharge.        Left eye: No discharge.     Extraocular Movements: Extraocular movements intact.     Conjunctiva/sclera: Conjunctivae normal.     Pupils: Pupils are equal, round, and reactive to light.  Neck:     Musculoskeletal: Normal range of motion and neck supple.     Thyroid: No thyromegaly.     Vascular: No carotid bruit or JVD.     Comments: No bruits thyromegaly or anterior cervical adenopathy Cardiovascular:     Rate and Rhythm: Normal rate and regular rhythm.     Heart sounds: Normal heart sounds. No murmur. No gallop.      Comments: Heart is regular at 84/min no edema Pulmonary:     Effort: Pulmonary effort is normal. No respiratory distress.     Breath sounds: Normal breath sounds. No wheezing or rales.     Comments: Clear anteriorly and posteriorly Abdominal:     General: Bowel sounds are normal.     Palpations: Abdomen is soft. There is no mass.     Tenderness: There is no abdominal tenderness. There is no guarding.     Comments: Morbidly obese without masses tenderness organ enlargement or bruits  Musculoskeletal: Normal range of motion.        General: Tenderness present.     Right lower leg: No edema.     Left lower leg: No edema.     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  Since injection by orthopedic surgeon this has given her more mobility and less pain.  Lymphadenopathy:     Cervical: No cervical adenopathy.  Skin:    General: Skin is warm and dry.     Findings: No lesion or rash.  Neurological:     General: No focal deficit present.     Mental Status: She is alert and oriented to person, place, and time. Mental status is at baseline.      Cranial Nerves: No cranial nerve deficit.     Deep Tendon Reflexes: Reflexes are normal and symmetric. Reflexes normal.  Psychiatric:        Mood and Affect: Mood normal.        Behavior: Behavior normal.        Thought Content: Thought content normal.        Judgment: Judgment normal.     Comments: Mood affect and behavior are all normal for this patient    BP 110/72 (BP Location: Left Arm)   Pulse 82   Temp (!) 97 F (36.1 C) (Oral)   Ht _0  (1.803 m)   Wt  279 lb (126.6 kg)   LMP 03/16/2011   BMI 38.91 kg/m         Assessment & Plan:  1. Seizure disorder ([place]) -Continue to follow-up with neurology get periodic drug levels - Levetiracetam level - Phenytoin level, free and total - CBC with Differential/Platelet  2. Hypothyroidism, unspecified type -Continue current treatment pending results of lab work - CBC with Differential/Platelet  3. Vitamin D deficiency -Continue with vitamin D replacement - CBC with Differential/Platelet - VITAMIN D 25 Hydroxy (Vit-D Deficiency, Fractures)  4. Essential hypertension -Blood pressure is good today and she will continue with current treatment - BMP8+EGFR - CBC with Differential/Platelet - Hepatic function panel  5. Pure hypercholesterolemia -Continue with weight watchers and statin therapy pending results of lab work - CBC with Differential/Platelet - Lipid panel  6. Morbid obesity due to excess calories ([place]) -Continue with weight watchers and therapeutic lifestyle changes  7. 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  In doing so we not only want to address your current medical conditions and concerns but also to  detect new conditions early and prevent illness, disease and health-related problems.    Medicare offers a yearly Wellness Visit which allows our clinical staff to assess your need for preventative services including immunizations, lifestyle education, counseling to decrease risk of preventable diseases and screening for fall risk and other medical concerns.    This visit is provided free of charge (no copay) for all Medicare recipients. The clinical pharmacists at [facility] have begun to conduct these Wellness Visits which will also include a thorough review of all your medications.    As you primary medical provider recommend that you make an appointment for your Annual Wellness Visit if you have not done so already this year.  You may set up this appointment before you leave today or you may call back ([phone]) and schedule an appointment.  Please make sure when you call that you mention that you are scheduling your Annual Wellness Visit with the clinical pharmacist so that the appointment may be made for the proper length of time.     Continue current medications. Continue good therapeutic lifestyle changes which include good diet and exercise. Fall precautions discussed with patient. If an FOBT was given today- please return it to our front desk. If you are over 66 years old - you may need Prevnar 50 or the adult Pneumonia vaccine.  **Flu shots are available--- please call and schedule a FLU-CLINIC appointment**  After your visit with [place] today you will receive a survey in the mail or online from [employer] regarding your care with [place]. Please take a moment to fill this out. Your feedback is very important to [place] as you can help [place] better understand your patient needs as well as improve your experience and satisfaction. WE CARE ABOUT YOU!!!   We will call with blood work results as soon as those results become available you should continue with your current Keppra and  Dilantin dosing regimen pending results of lab work We will send a copy of these reports to your neurologist. Continue with weight  watchers Follow-up with orthopedic surgeon as needed  Arrie Senate MD

## 2018-02-07 NOTE — Patient Instructions (Addendum)
Medicare Annual Wellness Visit  [facility] and the medical providers at [facility] strive to bring you the best medical care.  In doing so we not only want to address your current medical conditions and concerns but also to detect new conditions early and prevent illness, disease and health-related problems.    Medicare offers a yearly Wellness Visit which allows our clinical staff to assess your need for preventative services including immunizations, lifestyle education, counseling to decrease risk of preventable diseases and screening for fall risk and other medical concerns.    This visit is provided free of charge (no copay) for all Medicare recipients. The clinical pharmacists at [facility] have begun to conduct these Wellness Visits which will also include a thorough review of all your medications.    As you primary medical provider recommend that you make an appointment for your Annual Wellness Visit if you have not done so already this year.  You may set up this appointment before you leave today or you may call back ([phone]) and schedule an appointment.  Please make sure when you call that you mention that you are scheduling your Annual Wellness Visit with the clinical pharmacist so that the appointment may be made for the proper length of time.     Continue current medications. Continue good therapeutic lifestyle changes which include good diet and exercise. Fall precautions discussed with patient. If an FOBT was given today- please return it to our front desk. If you are over 18 years old - you may need Prevnar 13 or the adult Pneumonia vaccine.  **Flu shots are available--- please call and schedule a FLU-CLINIC appointment**  After your visit with [place] today you will receive a survey in the mail or online from [employer] regarding your care with [place]. Please take a moment to fill this out. Your feedback is very  important to [place] as you can help [place] better understand your patient needs as well as improve your experience and satisfaction. WE CARE ABOUT YOU!!!   We will call with blood work results as soon as those results become available you should continue with your current Keppra and Dilantin dosing regimen pending results of lab work We will send a copy of these reports to your neurologist. Continue with weight watchers Follow-up with orthopedic surgeon as needed

## 2018-02-08 ENCOUNTER — Encounter: Payer: Self-pay | Admitting: Family Medicine

## 2018-02-08 LAB — CBC WITH DIFFERENTIAL/PLATELET
BASOS ABS: 0 10*3/uL (ref 0.0–0.2)
Basos: 0 %
EOS (ABSOLUTE): 0.4 10*3/uL (ref 0.0–0.4)
Eos: 4 %
Hematocrit: 38 % (ref 34.0–46.6)
Hemoglobin: 13.3 g/dL (ref 11.1–15.9)
Immature Grans (Abs): 0 10*3/uL (ref 0.0–0.1)
Immature Granulocytes: 0 %
LYMPHS ABS: 2.6 10*3/uL (ref 0.7–3.1)
LYMPHS: 27 %
MCH: 30.3 pg (ref 26.6–33.0)
MCHC: 35 g/dL (ref 31.5–35.7)
MCV: 87 fL (ref 79–97)
Monocytes Absolute: 0.7 10*3/uL (ref 0.1–0.9)
Monocytes: 7 %
Neutrophils Absolute: 5.9 10*3/uL (ref 1.4–7.0)
Neutrophils: 62 %
PLATELETS: 233 10*3/uL (ref 150–450)
RBC: 4.39 x10E6/uL (ref 3.77–5.28)
RDW: 13.2 % (ref 11.7–15.4)
WBC: 9.7 10*3/uL (ref 3.4–10.8)

## 2018-02-08 LAB — LEVETIRACETAM LEVEL: LEVETIRACETAM: 12.8 ug/mL (ref 10.0–40.0)

## 2018-02-08 LAB — LIPID PANEL
CHOLESTEROL TOTAL: 180 mg/dL (ref 100–199)
Chol/HDL Ratio: 3.9 ratio (ref 0.0–4.4)
HDL: 46 mg/dL (ref >39)
LDL Calculated: 104 mg/dL — ABNORMAL HIGH (ref 0–99)
Triglycerides: 152 mg/dL — ABNORMAL HIGH (ref 0–149)
VLDL Cholesterol Cal: 30 mg/dL (ref 5–40)

## 2018-02-08 LAB — VITAMIN D 25 HYDROXY (VIT D DEFICIENCY, FRACTURES): Vit D, 25-Hydroxy: 42.1 ng/mL (ref 30.0–100.0)

## 2018-02-08 LAB — BMP8+EGFR
BUN / CREAT RATIO: 37 — AB (ref 9–23)
BUN: 32 mg/dL — ABNORMAL HIGH (ref 6–24)
CALCIUM: 10 mg/dL (ref 8.7–10.2)
CHLORIDE: 96 mmol/L (ref 96–106)
CO2: 23 mmol/L (ref 20–29)
Creatinine, Ser: 0.86 mg/dL (ref 0.57–1.00)
GFR calc Af Amer: 86 mL/min/{1.73_m2} (ref >59)
GFR calc non Af Amer: 74 mL/min/{1.73_m2} (ref >59)
GLUCOSE: 82 mg/dL (ref 65–99)
POTASSIUM: 3.9 mmol/L (ref 3.5–5.2)
Sodium: 140 mmol/L (ref 134–144)

## 2018-02-08 LAB — HEPATIC FUNCTION PANEL
ALT: 30 IU/L (ref 0–32)
AST: 20 IU/L (ref 0–40)
Albumin: 4.7 g/dL (ref 3.8–4.9)
Alkaline Phosphatase: 117 IU/L (ref 39–117)
Bilirubin Total: 0.3 mg/dL (ref 0.0–1.2)
Bilirubin, Direct: 0.13 mg/dL (ref 0.00–0.40)
Total Protein: 7.8 g/dL (ref 6.0–8.5)

## 2018-02-08 LAB — PHENYTOIN LEVEL, FREE AND TOTAL
PHENYTOIN FREE: 1.4 ug/mL (ref 1.0–2.0)
Phenytoin, Serum: 22.6 ug/mL (ref 10.0–20.0)

## 2018-02-25 MED FILL — DILANTIN 100 MG CAPSULE: 100 | 30 days supply | Qty: 180 | Fill #4

## 2018-03-26 MED FILL — HYDROCHLOROTHIAZIDE 25 MG T: 25 | 90 days supply | Qty: 90 | Fill #2

## 2018-03-26 MED FILL — LISINOPRIL 10 MG TABS: 10 | 90 days supply | Qty: 90 | Fill #2

## 2018-03-26 MED FILL — LEVOTHYROXINE 25 MCG TABLET: 25 | 90 days supply | Qty: 90 | Fill #2

## 2018-03-26 MED FILL — ROSUVASTATIN CALCIUM 20 MG: 20 | 90 days supply | Qty: 90 | Fill #2

## 2018-03-26 MED FILL — DILANTIN 100 MG CAPSULE: 100 | 30 days supply | Qty: 180 | Fill #5 | Status: TO

## 2018-04-01 DIAGNOSIS — M1711 Unilateral primary osteoarthritis, right knee: Secondary | ICD-10-CM | POA: Insufficient documentation

## 2018-04-03 ENCOUNTER — Encounter: Payer: Self-pay | Admitting: Family Medicine

## 2018-04-15 ENCOUNTER — Encounter: Payer: Self-pay | Admitting: Family Medicine

## 2018-04-15 MED ORDER — DOXYCYCLINE HYCLATE 100 MG PO TABS: 100.0000 mg | ORAL_TABLET | Freq: Two times a day (BID) | ORAL | 0 refills | Status: DC

## 2018-04-23 MED FILL — DILANTIN 100 MG CAPSULE: 100 | 30 days supply | Qty: 180 | Fill #0

## 2018-04-25 ENCOUNTER — Encounter: Payer: Self-pay | Admitting: Family Medicine

## 2018-05-06 MED FILL — levETIRAcetam 500 MG TABS: 500 | 90 days supply | Qty: 225 | Fill #0

## 2018-05-18 MED FILL — DILANTIN 100 MG CAPSULE: 100 | 30 days supply | Qty: 180 | Fill #1

## 2018-05-20 ENCOUNTER — Encounter: Payer: Self-pay | Admitting: Family Medicine

## 2018-06-13 ENCOUNTER — Encounter: Payer: Self-pay | Admitting: Family Medicine

## 2018-06-13 ENCOUNTER — Other Ambulatory Visit: Payer: Self-pay

## 2018-06-13 ENCOUNTER — Ambulatory Visit (INDEPENDENT_AMBULATORY_CARE_PROVIDER_SITE_OTHER): Payer: 59 | Admitting: Family Medicine

## 2018-06-13 DIAGNOSIS — E039 Hypothyroidism, unspecified: Secondary | ICD-10-CM

## 2018-06-13 DIAGNOSIS — G40909 Epilepsy, unspecified, not intractable, without status epilepticus: Secondary | ICD-10-CM

## 2018-06-13 DIAGNOSIS — E559 Vitamin D deficiency, unspecified: Secondary | ICD-10-CM

## 2018-06-13 DIAGNOSIS — E782 Mixed hyperlipidemia: Secondary | ICD-10-CM

## 2018-06-13 DIAGNOSIS — I10 Essential (primary) hypertension: Secondary | ICD-10-CM

## 2018-06-13 DIAGNOSIS — M25561 Pain in right knee: Secondary | ICD-10-CM | POA: Diagnosis not present

## 2018-06-13 MED ORDER — HYDROCHLOROTHIAZIDE 25 MG PO TABS: 25.0000 mg | ORAL_TABLET | Freq: Every day | ORAL | 3 refills | Status: DC

## 2018-06-13 MED ORDER — LEVOTHYROXINE SODIUM 25 MCG PO TABS: 25.0000 ug | ORAL_TABLET | Freq: Every day | ORAL | 3 refills | Status: DC

## 2018-06-13 MED ORDER — ROSUVASTATIN CALCIUM 20 MG PO TABS: 20.0000 mg | ORAL_TABLET | Freq: Every day | ORAL | 3 refills | Status: DC

## 2018-06-13 MED ORDER — PREDNISONE 10 MG PO TABS: ORAL_TABLET | ORAL | 0 refills | Status: DC

## 2018-06-13 NOTE — Progress Notes (Signed)
Virtual Visit Via telephone Note I connected with@ on 06/13/18 by telephone and verified that I am speaking with the correct person or authorized healthcare agent using two identifiers. Maria Rivers is currently located at home and there are no unauthorized people in close proximity. I completed this visit while in a private location in my home .  This visit type was conducted due to national recommendations for restrictions regarding the COVID-19 Pandemic (e.g. social distancing).  This format is felt to be most appropriate for this patient at this time.  All issues noted in this document were discussed and addressed.  No physical exam was performed.    I discussed the limitations, risks, security and privacy concerns of performing an evaluation and management service by telephone and the availability of in person appointments. I also discussed with the patient that there may be a patient responsible charge related to this service. The patient expressed understanding and agreed to proceed.   Date:  06/13/2018    ID:  Glenna Durandammy B Steil      10-18-1960        [id number]   Patient Care Team Patient Care Team: Ernestina PennaMoore, Ahmadou Bolz W, MD as PCP - [facility]Family Medicine)  Reason for Visit: Primary Care Follow-up     History of Present Illness & Review of Systems:     Maria Rivers is a 58 y.o. year old female primary care patient that presents today for a telehealth visit.  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  She generally does not want to do preventative care.  She does have a seizure disorder and is on thyroid replacement antiseizure medications and statin therapy.  She also takes medicine for her blood pressure.  The patient today is mainly having problems with her right knee.  She has been followed by [facility] and Dr. Lequita HaltAluisio for this.  Her pain is been increasing since January and with the damper weather she has had a been more pain.  The visits have been rescheduled because of  COVID-19.  She denies any chest pain shortness of breath trouble with her stomach including nausea vomiting diarrhea blood in the stool black tarry bowel movements or change in bowel habits.  She is passing her water well.  She has not had any additional seizure activity and will see the neurologist again in October and she sees him yearly.  She is due to get blood levels of her medicines in the office.  We will put in a future order so that she can get drug levels thyroid profile and she will come by at her convenience to get this done.  She does need refills on her Crestor levothyroxine and HCTZ.  Review of systems as stated, otherwise negative.  The patient does not have symptoms concerning for COVID-19 infection (fever, chills, cough, or new shortness of breath).      Current Medications (Verified) Allergies as of 06/13/2018      Reactions   Mushroom Extract Complex Swelling   Penicillins    UNKNOWN REACTION   Shellfish Allergy Swelling   Tamiflu [oseltamivir Phosphate] Rash   Severe rash, hives      Medication List       Accurate as of Jun 13, 2018 11:09 AM. If you have any questions, ask your nurse or doctor.        ALPRAZolam 0.25 MG tablet Commonly known as:  XANAX Take one half to one tablet at bedtime as  needed   cholecalciferol 1000 units tablet Commonly known as:  VITAMIN D Take 2,000 Units by mouth daily.   Dilantin 100 MG ER capsule Generic drug:  phenytoin Take 3 capsules (300 mg total) by mouth 2 (two) times daily.   doxycycline 100 MG tablet Commonly known as:  VIBRA-TABS Take 1 tablet (100 mg total) by mouth 2 (two) times daily.   hydrochlorothiazide 25 MG tablet Commonly known as:  HYDRODIURIL Take 1 tablet (25 mg total) by mouth daily.   ibuprofen 200 MG tablet Commonly known as:  ADVIL Take 200 mg by mouth 2 (two) times daily. For pain   levETIRAcetam 500 MG tablet Commonly known as:  KEPPRA Take 1/2 tablet in the AM and 2 tablets in the PM     levothyroxine 25 MCG tablet Commonly known as:  SYNTHROID Take 1 tablet (25 mcg total) by mouth daily.   lisinopril 10 MG tablet Commonly known as:  ZESTRIL Take 1 tablet (10 mg total) by mouth daily.   loratadine 10 MG tablet Commonly known as:  CLARITIN Take 10 mg by mouth daily.   multivitamin with minerals Tabs tablet Take 1 tablet by mouth daily.   rosuvastatin 20 MG tablet Commonly known as:  CRESTOR Take 1 tablet (20 mg total) by mouth daily.           Allergies (Verified)    Mushroom extract complex; Penicillins; Shellfish allergy; and Tamiflu [oseltamivir phosphate]  Past Medical History Past Medical History:  Diagnosis Date   Hyperlipidemia    Hypertension    Seizures (HCC)      No past surgical history on file.  Social History   Socioeconomic History   Marital status: Married    Spouse name: Not on file   Number of children: Not on file   Years of education: Not on file   Highest education level: Not on file  Occupational History    Comment: RN [employer]  Social Needs   Financial resource strain: Not on file   Food insecurity:    Worry: Not on file    Inability: Not on file   Transportation needs:    Medical: Not on file    Non-medical: Not on file  Tobacco Use   Smoking status: Never Smoker   Smokeless tobacco: Never Used  Substance and Sexual Activity   Alcohol use: No   Drug use: No   Sexual activity: Yes    Birth control/protection: None  Lifestyle   Physical activity:    Days per week: Not on file    Minutes per session: Not on file   Stress: Not on file  Relationships   Social connections:    Talks on phone: Not on file    Gets together: Not on file    Attends religious service: Not on file    Active member of club or organization: Not on file    Attends meetings of clubs or organizations: Not on file    Relationship status: Not on file  Other Topics Concern   Not on file  Social History Narrative   Not  on file     Family History  Problem Relation Age of Onset   Diabetes Other       Labs/Other Tests and Data Reviewed:    Wt Readings from Last 3 Encounters:  02/07/18 279 lb (126.6 kg)  10/18/17 288 lb (130.6 kg)  10/05/17 291 lb (132 kg)   Temp Readings from Last 3 Encounters:  02/07/18 Marland Kitchen)  97 F (36.1 C) (Oral)  10/05/17 (!) 97.5 F (36.4 C) (Oral)  06/01/17 97.6 F (36.4 C) (Oral)   BP Readings from Last 3 Encounters:  02/07/18 110/72  10/18/17 104/62  10/05/17 124/77   Pulse Readings from Last 3 Encounters:  02/07/18 82  10/18/17 83  10/05/17 82     Lab Results  Component Value Date   HGBA1C 5.2 10/06/2013   HGBA1C 5.2% 06/05/2013   Lab Results  Component Value Date   LDLCALC 104 (H) 02/07/2018   CREATININE 0.86 02/07/2018       Chemistry      Component Value Date/Time   NA 140 02/07/2018 1654   K 3.9 02/07/2018 1654   CL 96 02/07/2018 1654   CO2 23 02/07/2018 1654   BUN 32 (H) 02/07/2018 1654   CREATININE 0.86 02/07/2018 1654      Component Value Date/Time   CALCIUM 10.0 02/07/2018 1654   ALKPHOS 117 02/07/2018 1654   AST 20 02/07/2018 1654   ALT 30 02/07/2018 1654   BILITOT 0.3 02/07/2018 1654         OBSERVATIONS/ OBJECTIVE:     The patient was alert and answered questions appropriately.  Her weight is 264 pounds and she said it is down about 10 to 15 pounds since it was last checked and until she started having trouble with her knee she was riding her stationary bike regularly.  Her blood pressure was 120/70.  The knee pain has unfortunately affected her physical activity.  Physical exam deferred due to nature of telephonic visit.  ASSESSMENT & PLAN    Time:   Today, I have spent 25 minutes with the patient via telephone discussing the above including Covid precautions.     Visit Diagnoses: 1. Seizure disorder (HCC) -No additional seizure activity.  Continue to follow-up with neurology.  Get drug levels when routine blood work  is drawn in the office.  2. Vitamin D deficiency -Continue with vitamin D replacement as currently doing and check vitamin D level with next blood draw  3. Mixed hyperlipidemia -Continue with Crestor and with as aggressive therapeutic lifestyle changes as possible including diet and exercise geared to weight loss.  4. Hypothyroidism, unspecified type -Continue levothyroxine and refill this medication.  5. Essential hypertension -Blood pressure at home is good.  Patient will continue with current treatment and refill HCTZ  6. Morbid obesity due to excess calories (HCC) -Continue to work aggressively on weight loss through diet and exercise  7. Right knee pain, unspecified chronicity -Redness on 10 taper 8-day treatment and follow-up with Dr. Antony Odea as planned with June appointment  Patient Instructions  Continue medicines as currently doing Follow-up regularly with Dr. Anne Hahn Come to the office for lab work and drug levels Continue aggressive therapeutic lifestyle changes including diet and exercise to achieve weight loss Continue to practice good hand and respiratory hygiene Drink plenty of water and stay well-hydrated Follow-up with orthopedics as planned Prednisone as directed and do not take any NSAIDs while on prednisone     The above assessment and management plan was discussed with the patient. The patient verbalized understanding of and has agreed to the management plan. Patient is aware to call the clinic if symptoms persist or worsen. Patient is aware when to return to the clinic for a follow-up visit. Patient educated on when it is appropriate to go to the emergency department.    Ernestina Penna, MD [facility] [address] [place],  [place] [address] Ph [phone]   Nyra Capes MD

## 2018-06-13 NOTE — Addendum Note (Signed)
Addended by: Magdalene River on: 06/13/2018 01:28 PM   Modules accepted: Orders

## 2018-06-13 NOTE — Patient Instructions (Addendum)
Continue medicines as currently doing Follow-up regularly with Dr. Anne Hahn Come to the office for lab work and drug levels Continue aggressive therapeutic lifestyle changes including diet and exercise to achieve weight loss Continue to practice good hand and respiratory hygiene Drink plenty of water and stay well-hydrated Follow-up with orthopedics as planned Prednisone as directed and do not take any NSAIDs while on prednisone

## 2018-06-17 MED FILL — DILANTIN 100 MG CAPSULE: 100 | 30 days supply | Qty: 180 | Fill #2

## 2018-06-26 ENCOUNTER — Other Ambulatory Visit: Payer: Self-pay | Admitting: *Deleted

## 2018-06-26 MED ORDER — ROSUVASTATIN CALCIUM 20 MG PO TABS: 20.0000 mg | ORAL_TABLET | Freq: Every day | ORAL | 3 refills | Status: DC

## 2018-06-26 MED ORDER — HYDROCHLOROTHIAZIDE 25 MG PO TABS: 25.0000 mg | ORAL_TABLET | Freq: Every day | ORAL | 3 refills | Status: DC

## 2018-06-26 MED ORDER — LISINOPRIL 10 MG PO TABS: 10.0000 mg | ORAL_TABLET | Freq: Every day | ORAL | 3 refills | Status: DC

## 2018-06-26 MED ORDER — LEVOTHYROXINE SODIUM 25 MCG PO TABS: 25.0000 ug | ORAL_TABLET | Freq: Every day | ORAL | 3 refills | Status: DC

## 2018-06-26 MED FILL — LEVOTHYROXINE 25 MCG TABLET: 25 | 90 days supply | Qty: 90 | Fill #0

## 2018-06-26 MED FILL — LISINOPRIL 10 MG TABS: 10 | 90 days supply | Qty: 90 | Fill #0

## 2018-06-26 MED FILL — HYDROCHLOROTHIAZIDE 25 MG T: 25 | 90 days supply | Qty: 90 | Fill #0

## 2018-06-26 MED FILL — ROSUVASTATIN CALCIUM 20 MG: 20 | 90 days supply | Qty: 90 | Fill #0

## 2018-06-27 ENCOUNTER — Other Ambulatory Visit: Payer: Self-pay | Admitting: *Deleted

## 2018-06-27 MED ORDER — ALPRAZOLAM 0.25 MG PO TABS: ORAL_TABLET | ORAL | 3 refills | Status: DC

## 2018-06-27 MED FILL — ALPRAZolam 0.25 MG TABS: 0.25 | 30 days supply | Qty: 30 | Fill #0

## 2018-07-12 DIAGNOSIS — M1711 Unilateral primary osteoarthritis, right knee: Secondary | ICD-10-CM | POA: Diagnosis not present

## 2018-07-15 ENCOUNTER — Encounter: Payer: Self-pay | Admitting: Family Medicine

## 2018-07-16 ENCOUNTER — Other Ambulatory Visit: Payer: Self-pay | Admitting: *Deleted

## 2018-07-16 MED ORDER — DOXYCYCLINE HYCLATE 100 MG PO TABS: 100.0000 mg | ORAL_TABLET | Freq: Two times a day (BID) | ORAL | 3 refills | Status: DC

## 2018-07-16 MED FILL — DILANTIN 100 MG CAPSULE: 100 | 30 days supply | Qty: 180 | Fill #3

## 2018-07-16 MED FILL — DOXYCYCLINE HYC DR 100 MG T: 100 | 14 days supply | Qty: 28 | Fill #0

## 2018-07-16 NOTE — Telephone Encounter (Signed)
Pt called 6/30-jhb

## 2018-07-18 ENCOUNTER — Encounter: Payer: Self-pay | Admitting: *Deleted

## 2018-07-18 ENCOUNTER — Encounter: Payer: Self-pay | Admitting: Family Medicine

## 2018-07-29 MED FILL — levETIRAcetam 500 MG TABS: 500 | 90 days supply | Qty: 225 | Fill #1

## 2018-08-15 MED FILL — DILANTIN 100 MG CAPSULE: 100 | 30 days supply | Qty: 180 | Fill #4

## 2018-08-20 DIAGNOSIS — Z20828 Contact with and (suspected) exposure to other viral communicable diseases: Secondary | ICD-10-CM | POA: Diagnosis not present

## 2018-09-06 MED FILL — ROSUVASTATIN CALCIUM 20 MG: 20 | 90 days supply | Qty: 90 | Fill #0

## 2018-09-06 MED FILL — HYDROCHLOROTHIAZIDE 25 MG T: 25 | 90 days supply | Qty: 90 | Fill #0

## 2018-09-10 MED FILL — LEVOTHYROXINE 25 MCG TABLET: 25 | 90 days supply | Qty: 90 | Fill #0

## 2018-09-12 ENCOUNTER — Encounter: Payer: Self-pay | Admitting: Family Medicine

## 2018-09-14 MED FILL — DILANTIN 100 MG CAPSULE: 100 | 30 days supply | Qty: 180 | Fill #5

## 2018-10-15 ENCOUNTER — Other Ambulatory Visit: Payer: Self-pay

## 2018-10-16 ENCOUNTER — Encounter: Payer: Self-pay | Admitting: Family Medicine

## 2018-10-16 ENCOUNTER — Ambulatory Visit: Payer: 59 | Admitting: Family Medicine

## 2018-10-16 VITALS — BP 101/68 | HR 90 | Temp 97.7°F | Resp 20 | Ht 71.0 in | Wt 275.0 lb

## 2018-10-16 DIAGNOSIS — G40909 Epilepsy, unspecified, not intractable, without status epilepticus: Secondary | ICD-10-CM | POA: Diagnosis not present

## 2018-10-16 DIAGNOSIS — E559 Vitamin D deficiency, unspecified: Secondary | ICD-10-CM | POA: Insufficient documentation

## 2018-10-16 DIAGNOSIS — I10 Essential (primary) hypertension: Secondary | ICD-10-CM

## 2018-10-16 DIAGNOSIS — E782 Mixed hyperlipidemia: Secondary | ICD-10-CM | POA: Diagnosis not present

## 2018-10-16 DIAGNOSIS — E039 Hypothyroidism, unspecified: Secondary | ICD-10-CM | POA: Diagnosis not present

## 2018-10-16 DIAGNOSIS — Z7689 Persons encountering health services in other specified circumstances: Secondary | ICD-10-CM | POA: Diagnosis not present

## 2018-10-16 MED ORDER — LISINOPRIL 10 MG PO TABS: 10.0000 mg | ORAL_TABLET | Freq: Every day | ORAL | 3 refills | Status: DC

## 2018-10-16 MED ORDER — ROSUVASTATIN CALCIUM 20 MG PO TABS: 20.0000 mg | ORAL_TABLET | Freq: Every day | ORAL | 3 refills | Status: DC

## 2018-10-16 MED ORDER — HYDROCHLOROTHIAZIDE 25 MG PO TABS: 25.0000 mg | ORAL_TABLET | Freq: Every day | ORAL | 3 refills | Status: DC

## 2018-10-16 MED FILL — HYDROCHLOROTHIAZIDE 25 MG T: 25 | 90 days supply | Qty: 90 | Fill #0

## 2018-10-16 MED FILL — ROSUVASTATIN CALCIUM 20 MG: 20 | 90 days supply | Qty: 90 | Fill #0

## 2018-10-16 MED FILL — LISINOPRIL 10 MG TABS: 10 | 90 days supply | Qty: 90 | Fill #0

## 2018-10-16 NOTE — Patient Instructions (Signed)
It was a pleasure seeing you today, Arrianna.  Information regarding what we discussed is included in this packet.  Please make an appointment to see me in 3-4 months.   In a few days you may receive a survey in the mail or online from [employer] regarding your visit with [place] today. Please take a moment to fill this out. Your feedback is very important to our office. It can help [place] better understand your needs as well as improve your experience and satisfaction. Thank you for taking your time to complete it. We care about you.  Because of recent events of COVID-19 ("Coronavirus"), please follow CDC recommendations:   1. Wash your hand frequently 2. Avoid touching your face 3. Stay away from people who are sick 4. If you have symptoms such as fever, cough, shortness of breath then call your healthcare provider for further guidance 5. If you are sick, STAY AT HOME, unless otherwise directed by your healthcare provider. 6. Follow directions from state and national officials regarding staying safe    Please feel free to call our office if any questions or concerns arise.  Warm Regards, Monia Pouch, FNP-C [facility] [address] [place], [place] [address] [phone]

## 2018-10-16 NOTE — Progress Notes (Addendum)
Subjective:  Patient ID: Maria Rivers, female    DOB: 08/27/1960, 58 y.o.   MRN: [id number]  Patient Care Team: Baruch Gouty, FNP as PCP - General (Family Medicine)   Chief Complaint:  Medical Management of Chronic Issues (4 mo (moore)), Hypertension, Hypothyroidism, and Hyperlipidemia   HPI: Maria Rivers is a 58 y.o. female presenting on 10/16/2018 for Medical Management of Chronic Issues (4 mo (moore)), Hypertension, Hypothyroidism, and Hyperlipidemia  1. Seizure disorder ([place])  Well controlled on current regimen. Has not had a seizure in over a year. Has not  Seen neurology this year due to COVID-19 but does have upcoming appointment.  Will check drug levels today.    2. Vitamin D deficiency  Pt is taking oral repletion therapy. Denies bone pain and tenderness, muscle weakness, fracture, and difficulty walking. Lab Results  Component Value Date   VD25OH 42.1 02/07/2018   VD25OH 39.8 10/05/2017   VD25OH 45.7 06/01/2017   Lab Results  Component Value Date   CALCIUM 10.0 02/07/2018      3. Mixed hyperlipidemia  Compliant with medications - Yes Current medications - Crestor Side effects from medications - No Diet - balanced Exercise - active daily, no set exercise plan. Has lost weight.   Lab Results  Component Value Date   CHOL 180 02/07/2018   HDL 46 02/07/2018   LDLCALC 104 (H) 02/07/2018   TRIG 152 (H) 02/07/2018   CHOLHDL 3.9 02/07/2018     Family and personal medical history reviewed. Smoking and ETOH history reviewed.    4. Acquired hypothyroidism  Compliant with medications - Yes Current medications - Synthroid 25 mcg Adverse side effects - No Weight - has lost a few pounds  Bowel habit changes - No Heat or cold intolerance - No Mood changes - No Changes in sleep habits - No Fatigue - No Skin, hair, or nail changes - No Tremor - No Palpitations - No Edema - No Shortness of breath - No  Lab Results  Component Value Date   TSH 2.340  10/05/2017     5. Essential hypertension  Complaint with meds - Yes Current Medications - HCTZ, Lisinopril Checking BP at home ranging 120's/70's Exercising Regularly - No Watching Salt intake - Yes Pertinent ROS:  Headache - No Fatigue - No Visual Disturbances - No Chest pain - No Dyspnea - No Palpitations - No LE edema - No They report good compliance with medications and can restate their regimen by memory. No medication side effects.  Family, social, and smoking history reviewed.   BP Readings from Last 3 Encounters:  10/16/18 101/68  02/07/18 110/72  10/18/17 104/62   CMP Latest Ref Rng & Units 02/07/2018 10/05/2017 06/01/2017  Glucose 65 - 99 mg/dL 82 86 82  BUN 6 - 24 mg/dL 32(H) 26(H) 24  Creatinine 0.57 - 1.00 mg/dL 0.86 0.82 0.83  Sodium 134 - 144 mmol/L 140 137 141  Potassium 3.5 - 5.2 mmol/L 3.9 4.0 4.3  Chloride 96 - 106 mmol/L 96 98 99  CO2 20 - 29 mmol/L '23 24 24  ' Calcium 8.7 - 10.2 mg/dL 10.0 9.4 9.9  Total Protein 6.0 - 8.5 g/dL 7.8 6.7 7.2  Total Bilirubin 0.0 - 1.2 mg/dL 0.3 0.2 0.3  Alkaline Phos 39 - 117 IU/L 117 102 103  AST 0 - 40 IU/L '20 17 21  ' ALT 0 - 32 IU/L '30 19 25         ' Relevant past medical,  surgical, family, and social history reviewed and updated as indicated.  Allergies and medications reviewed and updated. Date reviewed: Chart in Epic.   Past Medical History:  Diagnosis Date   Hyperlipidemia    Hypertension    Seizures ([place])     History reviewed. No pertinent surgical history.  Social History   Socioeconomic History   Marital status: Married    Spouse name: Not on file   Number of children: Not on file   Years of education: Not on file   Highest education level: Not on file  Occupational History    Comment: RN [employer]  Social Needs   Financial resource strain: Not on file   Food insecurity    Worry: Not on file    Inability: Not on file   Transportation needs    Medical: Not on file    Non-medical:  Not on file  Tobacco Use   Smoking status: Never Smoker   Smokeless tobacco: Never Used  Substance and Sexual Activity   Alcohol use: No   Drug use: No   Sexual activity: Yes    Birth control/protection: None  Lifestyle   Physical activity    Days per week: Not on file    Minutes per session: Not on file   Stress: Not on file  Relationships   Social connections    Talks on phone: Not on file    Gets together: Not on file    Attends religious service: Not on file    Active member of club or organization: Not on file    Attends meetings of clubs or organizations: Not on file    Relationship status: Not on file   Intimate partner violence    Fear of current or ex partner: Not on file    Emotionally abused: Not on file    Physically abused: Not on file    Forced sexual activity: Not on file  Other Topics Concern   Not on file  Social History Narrative   Not on file    Outpatient Encounter Medications as of 10/16/2018  Medication Sig   cholecalciferol (VITAMIN D) 1000 UNITS tablet Take 2,000 Units by mouth daily.    DILANTIN 100 MG ER capsule Take 3 capsules (300 mg total) by mouth 2 (two) times daily.   hydrochlorothiazide (HYDRODIURIL) 25 MG tablet Take 1 tablet (25 mg total) by mouth daily.   ibuprofen (ADVIL,MOTRIN) 200 MG tablet Take 200 mg by mouth 2 (two) times daily. For pain   levETIRAcetam (KEPPRA) 500 MG tablet Take 1/2 tablet in the AM and 2 tablets in the PM   levothyroxine (SYNTHROID) 25 MCG tablet Take 1 tablet (25 mcg total) by mouth daily.   lisinopril (ZESTRIL) 10 MG tablet Take 1 tablet (10 mg total) by mouth daily.   loratadine (CLARITIN) 10 MG tablet Take 10 mg by mouth daily.   Multiple Vitamin (MULTIVITAMIN WITH MINERALS) TABS tablet Take 1 tablet by mouth daily.   rosuvastatin (CRESTOR) 20 MG tablet Take 1 tablet (20 mg total) by mouth daily.   [DISCONTINUED] hydrochlorothiazide (HYDRODIURIL) 25 MG tablet Take 1 tablet (25 mg total)  by mouth daily.   [DISCONTINUED] lisinopril (ZESTRIL) 10 MG tablet Take 1 tablet (10 mg total) by mouth daily.   [DISCONTINUED] rosuvastatin (CRESTOR) 20 MG tablet Take 1 tablet (20 mg total) by mouth daily.   [DISCONTINUED] ALPRAZolam (XANAX) 0.25 MG tablet Take one half to one tablet at bedtime as needed   [DISCONTINUED] doxycycline (VIBRA-TABS) 100  MG tablet Take 1 tablet (100 mg total) by mouth 2 (two) times daily.   [DISCONTINUED] predniSONE (DELTASONE) 10 MG tablet Take 1 tab QID x 2 days, then 1 tab TID x 2 days, then 1 tab BID x 2 days, then 1 tab QD x 2 days   No facility-administered encounter medications on file as of 10/16/2018.     Allergies  Allergen Reactions   Mushroom Extract Complex Swelling   Penicillins     UNKNOWN REACTION   Shellfish Allergy Swelling   Tamiflu [Oseltamivir Phosphate] Rash    Severe rash, hives    Review of Systems  Constitutional: Negative for activity change, appetite change, chills, diaphoresis, fatigue, fever and unexpected weight change.  HENT: Negative.   Eyes: Negative.  Negative for photophobia and visual disturbance.  Respiratory: Negative for cough, chest tightness, shortness of breath and wheezing.   Cardiovascular: Negative for chest pain, palpitations and leg swelling.  Gastrointestinal: Negative for abdominal pain, blood in stool, constipation, diarrhea, nausea and vomiting.  Endocrine: Negative.  Negative for cold intolerance, heat intolerance, polydipsia, polyphagia and polyuria.  Genitourinary: Negative for decreased urine volume, difficulty urinating, dysuria, frequency and urgency.  Musculoskeletal: Positive for arthralgias. Negative for myalgias.  Skin: Negative.  Negative for color change, pallor and rash.  Allergic/Immunologic: Negative.   Neurological: Negative for dizziness, tremors, seizures, syncope, facial asymmetry, speech difficulty, weakness, light-headedness, numbness and headaches.  Hematological: Negative.   Does not bruise/bleed easily.  Psychiatric/Behavioral: Negative for confusion, hallucinations, sleep disturbance and suicidal ideas.  All other systems reviewed and are negative.       Objective:  BP 101/68    Pulse 90    Temp 97.7 F (36.5 C)    Resp 20    Ht '5\' 11"'  (1.803 m)    Wt 275 lb (124.7 kg)    LMP 03/16/2011    SpO2 98%    BMI 38.35 kg/m    Wt Readings from Last 3 Encounters:  10/16/18 275 lb (124.7 kg)  02/07/18 279 lb (126.6 kg)  10/18/17 288 lb (130.6 kg)    Physical Exam Vitals signs and nursing note reviewed.  Constitutional:      General: She is not in acute distress.    Appearance: Normal appearance. She is well-developed and well-groomed. She is morbidly obese. She is not ill-appearing, toxic-appearing or diaphoretic.  HENT:     Head: Normocephalic and atraumatic.     Jaw: There is normal jaw occlusion.     Right Ear: Hearing, tympanic membrane, ear canal and external ear normal. There is no impacted cerumen.     Left Ear: Hearing, tympanic membrane, ear canal and external ear normal. There is no impacted cerumen.     Nose: Nose normal.     Mouth/Throat:     Lips: Pink.     Mouth: Mucous membranes are moist.     Pharynx: Oropharynx is clear. Uvula midline.  Eyes:     General: Lids are normal.     Extraocular Movements: Extraocular movements intact.     Conjunctiva/sclera: Conjunctivae normal.     Pupils: Pupils are equal, round, and reactive to light.  Neck:     Musculoskeletal: Normal range of motion and neck supple.     Thyroid: No thyroid mass, thyromegaly or thyroid tenderness.     Vascular: No carotid bruit or JVD.     Trachea: Trachea and phonation normal.  Cardiovascular:     Rate and Rhythm: Normal rate and regular rhythm.  Chest Wall: PMI is not displaced.     Pulses: Normal pulses.     Heart sounds: Normal heart sounds. No murmur. No friction rub. No gallop.   Pulmonary:     Effort: Pulmonary effort is normal. No respiratory distress.       Breath sounds: Normal breath sounds. No wheezing.  Abdominal:     General: Bowel sounds are normal. There is no distension or abdominal bruit.     Palpations: Abdomen is soft. There is no hepatomegaly or splenomegaly.     Tenderness: There is no abdominal tenderness. There is no right CVA tenderness or left CVA tenderness.     Hernia: No hernia is present.  Musculoskeletal:     Right hip: Normal.     Left hip: Normal.     Right knee: She exhibits decreased range of motion, swelling and bony tenderness. She exhibits no effusion, no ecchymosis, no deformity, no laceration, no erythema, normal alignment, no LCL laxity, normal patellar mobility, normal meniscus and no MCL laxity.     Left knee: She exhibits decreased range of motion, swelling and bony tenderness. She exhibits no effusion, no ecchymosis, no deformity, no laceration, no erythema, normal alignment, no LCL laxity, normal patellar mobility, normal meniscus and no MCL laxity.     Right ankle: Normal.     Left ankle: Normal.     Right lower leg: 2+ Edema present.     Left lower leg: 2+ Edema present.  Lymphadenopathy:     Cervical: No cervical adenopathy.  Skin:    General: Skin is warm and dry.     Capillary Refill: Capillary refill takes less than 2 seconds.     Coloration: Skin is not cyanotic, jaundiced or pale.     Findings: No rash.  Neurological:     General: No focal deficit present.     Mental Status: She is alert and oriented to person, place, and time.     Cranial Nerves: Cranial nerves are intact. No cranial nerve deficit.     Sensory: Sensation is intact. No sensory deficit.     Motor: Motor function is intact. No weakness.     Coordination: Coordination is intact. Coordination normal.     Gait: Gait is intact. Gait normal.     Deep Tendon Reflexes: Reflexes are normal and symmetric. Reflexes normal.  Psychiatric:        Attention and Perception: Attention and perception normal.        Mood and Affect: Mood  and affect normal.        Speech: Speech normal.        Behavior: Behavior normal. Behavior is cooperative.        Thought Content: Thought content normal.        Cognition and Memory: Cognition and memory normal.        Judgment: Judgment normal.     Results for orders placed or performed in visit on 02/07/18  Levetiracetam level  Result Value Ref Range   Levetiracetam Lvl 12.8 10.0 - 40.0 ug/mL  Phenytoin level, free and total  Result Value Ref Range   Phenytoin, Serum 22.6 (HH) 10.0 - 20.0 ug/mL   Phenytoin, Free 1.4 1.0 - 2.0 ug/mL  BMP8+EGFR  Result Value Ref Range   Glucose 82 65 - 99 mg/dL   BUN 32 (H) 6 - 24 mg/dL   Creatinine, Ser 0.86 0.57 - 1.00 mg/dL   GFR calc non Af Amer 74 >59 mL/min/1.73   GFR calc  Af Amer 86 >59 mL/min/1.73   BUN/Creatinine Ratio 37 (H) 9 - 23   Sodium 140 134 - 144 mmol/L   Potassium 3.9 3.5 - 5.2 mmol/L   Chloride 96 96 - 106 mmol/L   CO2 23 20 - 29 mmol/L   Calcium 10.0 8.7 - 10.2 mg/dL  CBC with Differential/Platelet  Result Value Ref Range   WBC 9.7 3.4 - 10.8 x10E3/uL   RBC 4.39 3.77 - 5.28 x10E6/uL   Hemoglobin 13.3 11.1 - 15.9 g/dL   Hematocrit 38.0 34.0 - 46.6 %   MCV 87 79 - 97 fL   MCH 30.3 26.6 - 33.0 pg   MCHC 35.0 31.5 - 35.7 g/dL   RDW 13.2 11.7 - 15.4 %   Platelets 233 150 - 450 x10E3/uL   Neutrophils 62 Not Estab. %   Lymphs 27 Not Estab. %   Monocytes 7 Not Estab. %   Eos 4 Not Estab. %   Basos 0 Not Estab. %   Neutrophils Absolute 5.9 1.4 - 7.0 x10E3/uL   Lymphocytes Absolute 2.6 0.7 - 3.1 x10E3/uL   Monocytes Absolute 0.7 0.1 - 0.9 x10E3/uL   EOS (ABSOLUTE) 0.4 0.0 - 0.4 x10E3/uL   Basophils Absolute 0.0 0.0 - 0.2 x10E3/uL   Immature Granulocytes 0 Not Estab. %   Immature Grans (Abs) 0.0 0.0 - 0.1 x10E3/uL  Lipid panel  Result Value Ref Range   Cholesterol, Total 180 100 - 199 mg/dL   Triglycerides 152 (H) 0 - 149 mg/dL   HDL 46 >39 mg/dL   VLDL Cholesterol Cal 30 5 - 40 mg/dL   LDL Calculated 104 (H) 0 -  99 mg/dL   Chol/HDL Ratio 3.9 0.0 - 4.4 ratio  VITAMIN D 25 Hydroxy (Vit-D Deficiency, Fractures)  Result Value Ref Range   Vit D, 25-Hydroxy 42.1 30.0 - 100.0 ng/mL  Hepatic function panel  Result Value Ref Range   Total Protein 7.8 6.0 - 8.5 g/dL   Albumin 4.7 3.8 - 4.9 g/dL   Bilirubin Total 0.3 0.0 - 1.2 mg/dL   Bilirubin, Direct 0.13 0.00 - 0.40 mg/dL   Alkaline Phosphatase 117 39 - 117 IU/L   AST 20 0 - 40 IU/L   ALT 30 0 - 32 IU/L       Pertinent labs & imaging results that were available during my care of the patient were reviewed by me and considered in my medical decision making.  Assessment & Plan:  Violia was seen today for medical management of chronic issues, hypertension, hypothyroidism and hyperlipidemia.  Diagnoses and all orders for this visit:  Seizure disorder ([place]) Well controlled on current regimen. Has follow up with neurology soon. Will check drugh levels today to see if in therapeutic range.  -     Levetiracetam level -     Phenobarbital level - ordered in error, Dilantin level ordered. -     CBC with Differential/Platelet  Vitamin D deficiency Labs pending. Continue repletion therapy. If indicated, will change repletion dosage. Eat foods rich in Vit D including milk, orange juice, yogurt with vitamin D added, salmon or mackerel, canned tuna fish, cereals with vitamin D added, and cod liver oil. Get out in the sun but make sure to wear at least SPF 30 sunscreen.  -     CBC with Differential/Platelet  Mixed hyperlipidemia Goal BMI < 25. Continue medications as prescribed. Follow up in 3-6 months as discussed.  -     CBC with Differential/Platelet -  Lipid panel -     rosuvastatin (CRESTOR) 20 MG tablet; Take 1 tablet (20 mg total) by mouth daily.  Acquired hypothyroidism Thyroid disease has been well controlled. Labs are pending. Adjustments to regimen will be made if warranted. Make sure to take medications on an empty stomach with a full glass of  water. Make sure to avoid vitamins or supplements for at least 4 hours before and 4 hours after taking medications. Repeat labs in 3 months if adjustments are made and in 6 months if stable.   -     Thyroid Panel With TSH -     CBC with Differential/Platelet  Essential hypertension BP well controlled. Changes were not made in regimen today. Goal BP 130/80. Pt aware to report any persistent high or low readings. DASH diet and exercise encouraged. Exercise at least 150 minutes per week and increase as tolerated. Goal BMI > 25. Stress management encouraged. Avoid excessive alcohol. Avoid NSAID's. Avoid more than 2000 mg of sodium daily. Medications as prescribed. Follow up as scheduled. -     CBC with Differential/Platelet -     CMP14+EGFR -     hydrochlorothiazide (HYDRODIURIL) 25 MG tablet; Take 1 tablet (25 mg total) by mouth daily. -     lisinopril (ZESTRIL) 10 MG tablet; Take 1 tablet (10 mg total) by mouth daily.    Pt declines all health maintenance.   Continue all other maintenance medications.  Follow up plan: Return in about 3 months (around 01/15/2019), or if symptoms worsen or fail to improve.  Continue healthy lifestyle choices, including diet (rich in fruits, vegetables, and lean proteins, and low in salt and simple carbohydrates) and exercise (at least 30 minutes of moderate physical activity daily).  Educational handout given for survey, COVID-19  The above assessment and management plan was discussed with the patient. The patient verbalized understanding of and has agreed to the management plan. Patient is aware to call the clinic if they develop any new symptoms or if symptoms persist or worsen. Patient is aware when to return to the clinic for a follow-up visit. Patient educated on when it is appropriate to go to the emergency department.   Monia Pouch, FNP-C [facility] [phone]

## 2018-10-17 ENCOUNTER — Encounter: Payer: Self-pay | Admitting: Family Medicine

## 2018-10-17 ENCOUNTER — Ambulatory Visit: Payer: 59 | Admitting: Family Medicine

## 2018-10-17 ENCOUNTER — Telehealth: Payer: Self-pay | Admitting: Family Medicine

## 2018-10-17 ENCOUNTER — Other Ambulatory Visit: Payer: Self-pay | Admitting: *Deleted

## 2018-10-17 DIAGNOSIS — G40909 Epilepsy, unspecified, not intractable, without status epilepticus: Secondary | ICD-10-CM

## 2018-10-17 MED ORDER — DILANTIN 100 MG PO CAPS: 300.0000 mg | ORAL_CAPSULE | Freq: Two times a day (BID) | ORAL | 11 refills | Status: DC

## 2018-10-17 MED FILL — DILANTIN 100 MG CAPSULE: 100 | 30 days supply | Qty: 180 | Fill #0

## 2018-10-17 NOTE — Telephone Encounter (Signed)
It was supposed to be a phenytoin level. Can we see if lab can add this and credit her for the wrong test? Must have been ordered in error.

## 2018-10-17 NOTE — Telephone Encounter (Signed)
I called pt and she is getting thru PAP, expires 01-16-2020. Using [employer].  Will renew and will keep f/u appt with Dr. Jannifer Franklin.

## 2018-10-17 NOTE — Telephone Encounter (Signed)
Has appt 10-24-18 with Dr. Jannifer Franklin for annual RV.  LMVM for pt to return call.  Is she needing refill from [facility] or getting thru PAP?

## 2018-10-17 NOTE — Telephone Encounter (Signed)
Lab will check to see if this can be added on.

## 2018-10-18 ENCOUNTER — Ambulatory Visit (INDEPENDENT_AMBULATORY_CARE_PROVIDER_SITE_OTHER): Payer: 59 | Admitting: *Deleted

## 2018-10-18 ENCOUNTER — Other Ambulatory Visit: Payer: Self-pay

## 2018-10-18 DIAGNOSIS — Z23 Encounter for immunization: Secondary | ICD-10-CM

## 2018-10-18 LAB — CBC WITH DIFFERENTIAL/PLATELET
Basophils Absolute: 0 10*3/uL (ref 0.0–0.2)
Basos: 0 %
EOS (ABSOLUTE): 0.3 10*3/uL (ref 0.0–0.4)
Eos: 4 %
Hematocrit: 38.1 % (ref 34.0–46.6)
Hemoglobin: 12.8 g/dL (ref 11.1–15.9)
Immature Grans (Abs): 0 10*3/uL (ref 0.0–0.1)
Immature Granulocytes: 1 %
Lymphocytes Absolute: 2.1 10*3/uL (ref 0.7–3.1)
Lymphs: 28 %
MCH: 30 pg (ref 26.6–33.0)
MCHC: 33.6 g/dL (ref 31.5–35.7)
MCV: 89 fL (ref 79–97)
Monocytes Absolute: 0.6 10*3/uL (ref 0.1–0.9)
Monocytes: 7 %
Neutrophils Absolute: 4.5 10*3/uL (ref 1.4–7.0)
Neutrophils: 60 %
Platelets: 225 10*3/uL (ref 150–450)
RBC: 4.27 x10E6/uL (ref 3.77–5.28)
RDW: 12.7 % (ref 11.7–15.4)
WBC: 7.5 10*3/uL (ref 3.4–10.8)

## 2018-10-18 LAB — CMP14+EGFR
ALT: 22 IU/L (ref 0–32)
AST: 19 IU/L (ref 0–40)
Albumin/Globulin Ratio: 1.6 (ref 1.2–2.2)
Albumin: 4.4 g/dL (ref 3.8–4.9)
Alkaline Phosphatase: 113 IU/L (ref 39–117)
BUN/Creatinine Ratio: 41 — ABNORMAL HIGH (ref 9–23)
BUN: 34 mg/dL — ABNORMAL HIGH (ref 6–24)
Bilirubin Total: <0.2 mg/dL (ref 0.0–1.2)
CO2: 22 mmol/L (ref 20–29)
Calcium: 9.8 mg/dL (ref 8.7–10.2)
Chloride: 100 mmol/L (ref 96–106)
Creatinine, Ser: 0.83 mg/dL (ref 0.57–1.00)
GFR calc Af Amer: 89 mL/min/{1.73_m2} (ref >59)
GFR calc non Af Amer: 77 mL/min/{1.73_m2} (ref >59)
Globulin, Total: 2.7 g/dL (ref 1.5–4.5)
Glucose: 112 mg/dL — ABNORMAL HIGH (ref 65–99)
Potassium: 4.6 mmol/L (ref 3.5–5.2)
Sodium: 137 mmol/L (ref 134–144)
Total Protein: 7.1 g/dL (ref 6.0–8.5)

## 2018-10-18 LAB — LIPID PANEL
Chol/HDL Ratio: 3.6 ratio (ref 0.0–4.4)
Cholesterol, Total: 166 mg/dL (ref 100–199)
HDL: 46 mg/dL (ref >39)
LDL Chol Calc (NIH): 93 mg/dL (ref 0–99)
Triglycerides: 158 mg/dL — ABNORMAL HIGH (ref 0–149)
VLDL Cholesterol Cal: 27 mg/dL (ref 5–40)

## 2018-10-18 LAB — THYROID PANEL WITH TSH
Free Thyroxine Index: 1.6 (ref 1.2–4.9)
T3 Uptake Ratio: 23 % — ABNORMAL LOW (ref 24–39)
T4, Total: 7.1 ug/dL (ref 4.5–12.0)
TSH: 2.62 u[IU]/mL (ref 0.450–4.500)

## 2018-10-18 LAB — PHENOBARBITAL LEVEL: Phenobarbital, Serum: <3 ug/mL — ABNORMAL LOW (ref 15–40)

## 2018-10-18 LAB — LEVETIRACETAM LEVEL: Levetiracetam Lvl: 27 ug/mL (ref 10.0–40.0)

## 2018-10-21 LAB — SPECIMEN STATUS REPORT

## 2018-10-21 LAB — PHENYTOIN LEVEL, FREE: Phenytoin, Free: 1.1 ug/mL (ref 1.0–2.0)

## 2018-10-24 ENCOUNTER — Other Ambulatory Visit: Payer: Self-pay

## 2018-10-24 ENCOUNTER — Encounter: Payer: Self-pay | Admitting: Neurology

## 2018-10-24 ENCOUNTER — Ambulatory Visit: Payer: 59 | Admitting: Neurology

## 2018-10-24 VITALS — BP 110/64 | HR 91 | Temp 97.9°F | Ht 71.0 in | Wt 276.0 lb

## 2018-10-24 DIAGNOSIS — G40309 Generalized idiopathic epilepsy and epileptic syndromes, not intractable, without status epilepticus: Secondary | ICD-10-CM

## 2018-10-24 MED ORDER — LEVETIRACETAM 500 MG PO TABS: ORAL_TABLET | ORAL | 3 refills | Status: DC

## 2018-10-24 MED FILL — levETIRAcetam 500 MG TABS: 500 | 90 days supply | Qty: 225 | Fill #0

## 2018-10-24 NOTE — Progress Notes (Signed)
Reason for visit: Seizures  Maria Rivers is an 58 y.o. female  History of present illness:  Maria Rivers is a 58 year old right-handed white female with a history of seizures that have been under relatively good control with the use of Dilantin and Keppra.  The patient is on vitamin D supplementation.  She has had recent blood levels done with a therapeutic Keppra level of 27.0 and a free Dilantin level of 1.1 that is therapeutic.  The patient tolerates the medications well.  She is able to operate a motor vehicle without difficulty.  She is followed through orthopedic surgery for her knee arthritis and requires injections on occasion.  She returns to the office today for an evaluation.  Past Medical History:  Diagnosis Date  . Hyperlipidemia   . Hypertension   . Seizures ([facility])     No past surgical history on file.  Family History  Problem Relation Age of Onset  . Diabetes Other     Social history:  reports that she has never smoked. She has never used smokeless tobacco. She reports that she does not drink alcohol or use drugs.    Allergies  Allergen Reactions  . Mushroom Extract Complex Swelling  . Penicillins     UNKNOWN REACTION  . Shellfish Allergy Swelling  . Tamiflu [Oseltamivir Phosphate] Rash    Severe rash, hives    Medications:  Prior to Admission medications   Medication Sig Start Date End Date Taking? Authorizing Provider  cholecalciferol (VITAMIN D) 1000 UNITS tablet Take 2,000 Units by mouth daily.    Yes [provider]  DILANTIN 100 MG ER capsule Take 3 capsules (300 mg total) by mouth 2 (two) times daily. 10/17/18  Yes York Spaniel, MD  hydrochlorothiazide (HYDRODIURIL) 25 MG tablet Take 1 tablet (25 mg total) by mouth daily. 10/16/18  Yes Rakes, Doralee Albino, FNP  ibuprofen (ADVIL,MOTRIN) 200 MG tablet Take 200 mg by mouth 2 (two) times daily. For pain   Yes [provider]  levETIRAcetam (KEPPRA) 500 MG tablet Take 1/2 tablet in the AM  and 2 tablets in the PM 10/23/17  Yes [name], NP  levothyroxine (SYNTHROID) 25 MCG tablet Take 1 tablet (25 mcg total) by mouth daily. 06/26/18  Yes Ernestina Penna, MD  lisinopril (ZESTRIL) 10 MG tablet Take 1 tablet (10 mg total) by mouth daily. 10/16/18  Yes Rakes, Doralee Albino, FNP  loratadine (CLARITIN) 10 MG tablet Take 10 mg by mouth daily.   Yes [provider]  Multiple Vitamin (MULTIVITAMIN WITH MINERALS) TABS tablet Take 1 tablet by mouth daily.   Yes [provider]  rosuvastatin (CRESTOR) 20 MG tablet Take 1 tablet (20 mg total) by mouth daily. 10/16/18  Yes Rakes, Doralee Albino, FNP    ROS:  Out of a complete 14 system review of symptoms, the patient complains only of the following symptoms, and all other reviewed systems are negative.  Joint pain  Blood pressure 110/64, pulse 91, temperature 97.9 F (36.6 C), temperature source Temporal, height 5\' 11"  (1.803 m), weight 276 lb (125.2 kg), last menstrual period 03/16/2011.  Physical Exam  General: The patient is alert and cooperative at the time of the examination.  The patient is markedly obese.  Skin: No significant peripheral edema is noted.   Neurologic Exam  Mental status: The patient is alert and oriented x 3 at the time of the examination. The patient has apparent normal recent and remote memory, with an apparently  normal attention span and concentration ability.   Cranial nerves: Facial symmetry is present. Speech is normal, no aphasia or dysarthria is noted. Extraocular movements are full. Visual fields are full.  Motor: The patient has good strength in all 4 extremities.  Sensory examination: Soft touch sensation is symmetric on the face, arms, and legs.  Coordination: The patient has good finger-nose-finger and heel-to-shin bilaterally.  Gait and station: The patient has a normal gait. Tandem gait is unsteady. Romberg is negative. No drift is seen.  Reflexes: Deep tendon reflexes are  symmetric.   Assessment/Plan:  1.  History of seizures, well controlled  The patient is doing well on Dilantin and Keppra, we will continue these medications.  A prescription for the Keppra was sent in.  She will follow-up in 1 year, sooner if needed.  Jill Alexanders MD 10/24/2018 3:31 PM  [facility] [address] [place], [place] [address]  Phone [phone] Fax [phone]

## 2018-10-29 MED FILL — DOXYCYCLINE HYCLATE 100 MG: 100 | 14 days supply | Qty: 28 | Fill #0

## 2018-10-29 MED FILL — LEVOTHYROXINE 25 MCG TABLET: 25 | 90 days supply | Qty: 90 | Fill #0

## 2018-10-31 DIAGNOSIS — M25561 Pain in right knee: Secondary | ICD-10-CM | POA: Diagnosis not present

## 2018-10-31 DIAGNOSIS — M1711 Unilateral primary osteoarthritis, right knee: Secondary | ICD-10-CM | POA: Diagnosis not present

## 2018-11-25 MED FILL — DOXYCYCLINE HYCLATE 100 MG: 100 | 14 days supply | Qty: 28 | Fill #1

## 2018-11-25 MED FILL — DILANTIN 100 MG CAPSULE: 100 | 30 days supply | Qty: 180 | Fill #1

## 2018-12-16 DIAGNOSIS — H2513 Age-related nuclear cataract, bilateral: Secondary | ICD-10-CM | POA: Diagnosis not present

## 2018-12-16 DIAGNOSIS — H5203 Hypermetropia, bilateral: Secondary | ICD-10-CM | POA: Diagnosis not present

## 2018-12-16 DIAGNOSIS — H52223 Regular astigmatism, bilateral: Secondary | ICD-10-CM | POA: Diagnosis not present

## 2018-12-16 DIAGNOSIS — H524 Presbyopia: Secondary | ICD-10-CM | POA: Diagnosis not present

## 2018-12-20 MED FILL — DILANTIN 100 MG CAPSULE: 100 | 30 days supply | Qty: 180 | Fill #2

## 2019-01-01 ENCOUNTER — Encounter: Payer: Self-pay | Admitting: Family Medicine

## 2019-01-02 ENCOUNTER — Encounter: Payer: Self-pay | Admitting: Family Medicine

## 2019-01-03 ENCOUNTER — Telehealth: Payer: Self-pay

## 2019-01-03 NOTE — Telephone Encounter (Signed)
She needs to follow up with ortho or here to determine when restrictions can be lifted. Pt should make an appointment here or with ortho.   [name]

## 2019-01-03 NOTE — Telephone Encounter (Signed)
Mingo Amber with [facility] [employer] received letter that was written for patient. She wants to know how long the restrictions will be in place. Please advise. She can be contacted at [phone]

## 2019-01-06 NOTE — Telephone Encounter (Signed)
Patient aware and states it has already been handled by ortho.

## 2019-01-08 ENCOUNTER — Encounter: Payer: Self-pay | Admitting: Family Medicine

## 2019-01-18 MED FILL — DILANTIN 100 MG CAPSULE: 100 | 30 days supply | Qty: 180 | Fill #3

## 2019-01-21 MED FILL — LISINOPRIL 10 MG TABS: 10 | 90 days supply | Qty: 90 | Fill #1

## 2019-01-21 MED FILL — HYDROCHLOROTHIAZIDE 25 MG T: 25 | 90 days supply | Qty: 90 | Fill #0

## 2019-01-21 MED FILL — LEVOTHYROXINE SODIUM 25 MCG: 25 | 90 days supply | Qty: 90 | Fill #1

## 2019-01-21 MED FILL — ROSUVASTATIN CALCIUM 20 MG: 20 | 90 days supply | Qty: 90 | Fill #1

## 2019-02-14 MED FILL — levETIRAcetam 500 MG TABS: 500 | 90 days supply | Qty: 225 | Fill #1

## 2019-02-16 ENCOUNTER — Encounter: Payer: Self-pay | Admitting: Family Medicine

## 2019-02-18 MED FILL — DILANTIN 100 MG CAPSULE: 100 | 30 days supply | Qty: 180 | Fill #4

## 2019-02-20 ENCOUNTER — Ambulatory Visit (INDEPENDENT_AMBULATORY_CARE_PROVIDER_SITE_OTHER): Payer: 59 | Admitting: Family Medicine

## 2019-02-20 ENCOUNTER — Encounter: Payer: Self-pay | Admitting: Family Medicine

## 2019-02-20 DIAGNOSIS — E782 Mixed hyperlipidemia: Secondary | ICD-10-CM

## 2019-02-20 DIAGNOSIS — I10 Essential (primary) hypertension: Secondary | ICD-10-CM | POA: Diagnosis not present

## 2019-02-20 DIAGNOSIS — E559 Vitamin D deficiency, unspecified: Secondary | ICD-10-CM | POA: Diagnosis not present

## 2019-02-20 DIAGNOSIS — E039 Hypothyroidism, unspecified: Secondary | ICD-10-CM

## 2019-02-20 DIAGNOSIS — Z5181 Encounter for therapeutic drug level monitoring: Secondary | ICD-10-CM

## 2019-02-20 NOTE — Progress Notes (Signed)
Virtual Visit via telephone Note Due to COVID-19 pandemic this visit was conducted virtually. This visit type was conducted due to national recommendations for restrictions regarding the COVID-19 Pandemic (e.g. social distancing, sheltering in place) in an effort to limit this patient's exposure and mitigate transmission in our community. All issues noted in this document were discussed and addressed.  A physical exam was not performed with this format.   I connected with Maria Rivers on 02/20/2019 at 1245 by telephone and verified that I am speaking with the correct person using two identifiers. Maria Rivers is currently located at work and no one is currently with them during visit. The provider, Monia Pouch, FNP is located in their office at time of visit.  I discussed the limitations, risks, security and privacy concerns of performing an evaluation and management service by telephone and the availability of in person appointments. I also discussed with the patient that there may be a patient responsible charge related to this service. The patient expressed understanding and agreed to proceed.  Subjective:  Patient ID: Maria Rivers, female    DOB: May 06, 1960, 59 y.o.   MRN: [id number]  Chief Complaint:  Medical Management of Chronic Issues   HPI: Maria Rivers is a 59 y.o. female presenting on 02/20/2019 for Medical Management of Chronic Issues   Pt following up today for management of chronic medical conditions. States overall she is doing well. She is compliant with all of her medications and is tolerating them well. States she does not need any refills today. She is taking Vit D repletion and tolerating well. She has been trying to watch her diet but has been very busy with work and life that she finds this hard. No regular exercise routine.   Hypertension This is a chronic problem. The current episode started more than 1 year ago. The problem is controlled. Pertinent negatives include no  anxiety, blurred vision, chest pain, headaches, malaise/fatigue, neck pain, orthopnea, palpitations, peripheral edema, PND, shortness of breath or sweats. Risk factors for coronary artery disease include dyslipidemia and obesity. Past treatments include ACE inhibitors. The current treatment provides significant improvement. Compliance problems include diet and exercise.  Identifiable causes of hypertension include a thyroid problem.  Hyperlipidemia This is a chronic problem. The current episode started more than 1 year ago. The problem is controlled. Recent lipid tests were reviewed and are variable. Exacerbating diseases include hypothyroidism and obesity. Pertinent negatives include no chest pain, focal sensory loss, focal weakness, leg pain, myalgias or shortness of breath. Current antihyperlipidemic treatment includes statins. The current treatment provides moderate improvement of lipids. Compliance problems include adherence to diet and adherence to exercise.  Risk factors for coronary artery disease include dyslipidemia, hypertension and obesity.  Thyroid Problem Presents for follow-up visit. Patient reports no anxiety, cold intolerance, constipation, depressed mood, diaphoresis, diarrhea, dry skin, fatigue, hair loss, heat intolerance, hoarse voice, leg swelling, menstrual problem, nail problem, palpitations, tremors, visual change, weight gain or weight loss. The symptoms have been stable. Her past medical history is significant for hyperlipidemia.     Relevant past medical, surgical, family, and social history reviewed and updated as indicated.  Allergies and medications reviewed and updated.   Past Medical History:  Diagnosis Date  . Hyperlipidemia   . Hypertension   . Seizures ([place])     History reviewed. No pertinent surgical history.  Social History   Socioeconomic History  . Marital status: Married    Spouse name: Not on file  .  Number of children: Not on file  . Years of  education: Not on file  . Highest education level: Not on file  Occupational History    Comment: RN [employer]  Tobacco Use  . Smoking status: Never Smoker  . Smokeless tobacco: Never Used  Substance and Sexual Activity  . Alcohol use: No  . Drug use: No  . Sexual activity: Yes    Birth control/protection: None  Other Topics Concern  . Not on file  Social History Narrative  . Not on file   Social Determinants of Health   Financial Resource Strain:   . Difficulty of Paying Living Expenses: Not on file  Food Insecurity:   . Worried About Charity fundraiser in the Last Year: Not on file  . Ran Out of Food in the Last Year: Not on file  Transportation Needs:   . Lack of Transportation (Medical): Not on file  . Lack of Transportation (Non-Medical): Not on file  Physical Activity:   . Days of Exercise per Week: Not on file  . Minutes of Exercise per Session: Not on file  Stress:   . Feeling of Stress : Not on file  Social Connections:   . Frequency of Communication with Friends and Family: Not on file  . Frequency of Social Gatherings with Friends and Family: Not on file  . Attends Religious Services: Not on file  . Active Member of Clubs or Organizations: Not on file  . Attends Archivist Meetings: Not on file  . Marital Status: Not on file  Intimate Partner Violence:   . Fear of Current or Ex-Partner: Not on file  . Emotionally Abused: Not on file  . Physically Abused: Not on file  . Sexually Abused: Not on file    Outpatient Encounter Medications as of 02/20/2019  Medication Sig  . cholecalciferol (VITAMIN D) 1000 UNITS tablet Take 2,000 Units by mouth daily.   Marland Kitchen DILANTIN 100 MG ER capsule Take 3 capsules (300 mg total) by mouth 2 (two) times daily.  Marland Kitchen doxycycline (DORYX) 100 MG EC tablet doxycycline hyclate 100 mg tablet,delayed release  . hydrochlorothiazide (HYDRODIURIL) 25 MG tablet Take 1 tablet (25 mg total) by mouth daily.  Marland Kitchen ibuprofen (ADVIL,MOTRIN) 200  MG tablet Take 200 mg by mouth 2 (two) times daily. For pain  . levETIRAcetam (KEPPRA) 500 MG tablet Take 1/2 tablet in the AM and 2 tablets in the PM  . levothyroxine (SYNTHROID) 25 MCG tablet Take 1 tablet (25 mcg total) by mouth daily.  Marland Kitchen lisinopril (ZESTRIL) 10 MG tablet Take 1 tablet (10 mg total) by mouth daily.  Marland Kitchen loratadine (CLARITIN) 10 MG tablet Take 10 mg by mouth daily.  . Multiple Vitamin (MULTIVITAMIN WITH MINERALS) TABS tablet Take 1 tablet by mouth daily.  . rosuvastatin (CRESTOR) 20 MG tablet Take 1 tablet (20 mg total) by mouth daily.   No facility-administered encounter medications on file as of 02/20/2019.    Allergies  Allergen Reactions  . Mushroom Extract Complex Swelling  . Penicillins     UNKNOWN REACTION  . Shellfish Allergy Swelling  . Tamiflu [Oseltamivir Phosphate] Rash    Severe rash, hives    Review of Systems  Constitutional: Negative for activity change, appetite change, chills, diaphoresis, fatigue, fever, malaise/fatigue, unexpected weight change, weight gain and weight loss.  HENT: Negative.  Negative for hoarse voice.   Eyes: Negative.  Negative for blurred vision, photophobia and visual disturbance.  Respiratory: Negative for cough, chest tightness  and shortness of breath.   Cardiovascular: Negative for chest pain, palpitations, orthopnea, leg swelling and PND.  Gastrointestinal: Negative for abdominal pain, blood in stool, constipation, diarrhea, nausea and vomiting.  Endocrine: Negative.  Negative for cold intolerance, heat intolerance, polydipsia, polyphagia and polyuria.  Genitourinary: Negative for decreased urine volume, difficulty urinating, dysuria, frequency, menstrual problem and urgency.  Musculoskeletal: Positive for arthralgias. Negative for myalgias and neck pain.  Skin: Negative.   Allergic/Immunologic: Negative.   Neurological: Negative for dizziness, tremors, focal weakness, seizures, syncope, facial asymmetry, speech difficulty,  weakness, light-headedness, numbness and headaches.  Hematological: Negative.   Psychiatric/Behavioral: Negative for confusion, hallucinations, sleep disturbance and suicidal ideas. The patient is not nervous/anxious.   All other systems reviewed and are negative.        Observations/Objective: No vital signs or physical exam, this was a telephone or virtual health encounter.  Pt alert and oriented, answers all questions appropriately, and able to speak in full sentences.    Assessment and Plan: Sanvika was seen today for medical management of chronic issues.  Diagnoses and all orders for this visit:  Essential hypertension Has been well controlled. Pt will come in to office to have labs completed. DASH diet and exercise encouraged. Continue current regimen.  -     CMP14+EGFR; Future -     CBC with Differential/Platelet; Future -     Thyroid Panel With TSH; Future  Morbid obesity due to excess calories (HCC) Diet and exercise encouraged. Labs pending.  -     CMP14+EGFR; Future -     CBC with Differential/Platelet; Future -     Lipid panel; Future -     Thyroid Panel With TSH; Future -     VITAMIN D 25 Hydroxy (Vit-D Deficiency, Fractures); Future  Mixed hyperlipidemia Diet encouraged - increase intake of fresh fruits and vegetables, increase intake of lean proteins. Bake, broil, or grill foods. Avoid fried, greasy, and fatty foods. Avoid fast foods. Increase intake of fiber-rich whole grains. Exercise encouraged - at least 150 minutes per week and advance as tolerated.  Goal BMI < 25. Continue medications as prescribed. Follow up in 3-6 months as discussed.  -     Lipid panel; Future  Acquired hypothyroidism Thyroid disease has been well controlled. Labs are pending. Adjustments to regimen will be made if warranted. Make sure to take medications on an empty stomach with a full glass of water. Make sure to avoid vitamins or supplements for at least 4 hours before and 4 hours after  taking medications. Repeat labs in 3 months if adjustments are made and in 6 months if stable.   -     Thyroid Panel With TSH; Future  Vitamin D deficiency Labs pending. Continue repletion therapy. If indicated, will change repletion dosage. Eat foods rich in Vit D including milk, orange juice, yogurt with vitamin D added, salmon or mackerel, canned tuna fish, cereals with vitamin D added, and cod liver oil. Get out in the sun but make sure to wear at least SPF 30 sunscreen.  -     VITAMIN D 25 Hydroxy (Vit-D Deficiency, Fractures); Future  Encounter for therapeutic drug monitoring On Keppra and dilantin for epilepsy, will check drug levels with next blood draw.  -     Levetiracetam level; Future -     Phenytoin level, free and total; Future     Follow Up Instructions: Return in about 3 months (around 05/20/2019), or if symptoms worsen or fail to improve.  I discussed the assessment and treatment plan with the patient. The patient was provided an opportunity to ask questions and all were answered. The patient agreed with the plan and demonstrated an understanding of the instructions.   The patient was advised to call back or seek an in-person evaluation if the symptoms worsen or if the condition fails to improve as anticipated.  The above assessment and management plan was discussed with the patient. The patient verbalized understanding of and has agreed to the management plan. Patient is aware to call the clinic if they develop any new symptoms or if symptoms persist or worsen. Patient is aware when to return to the clinic for a follow-up visit. Patient educated on when it is appropriate to go to the emergency department.    I provided 25 minutes of non-face-to-face time during this encounter. The call started at 1245. The call ended at 1310. The other time was used for coordination of care.    Monia Pouch, FNP-C [facility] [address] [place],  [place] [address] [phone] 02/20/2019

## 2019-03-11 IMAGING — DX DG KNEE 1-2V*R*
2 series · 2 of 2 positions shown · non-contrast
Comparison: None.

CLINICAL DATA: Chronic right knee pain.

EXAM:
RIGHT KNEE - 1-2 VIEW

[knee ap]
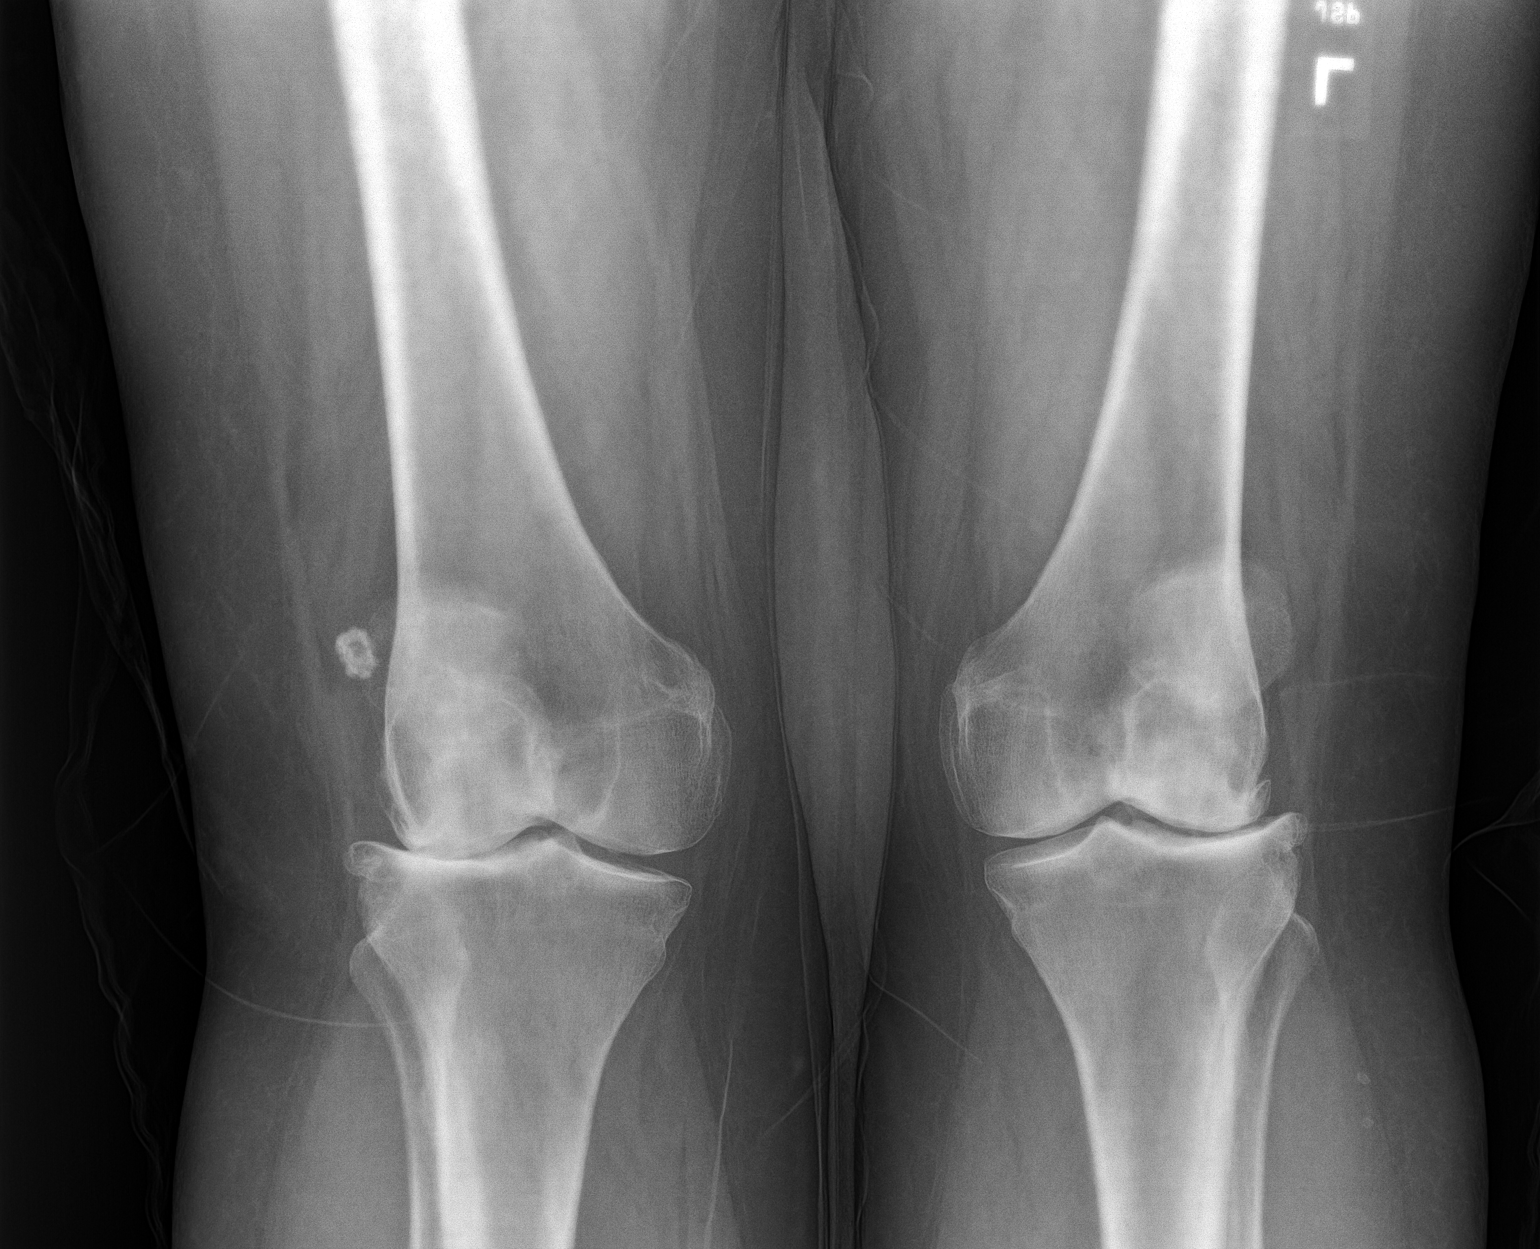

[knee lat]
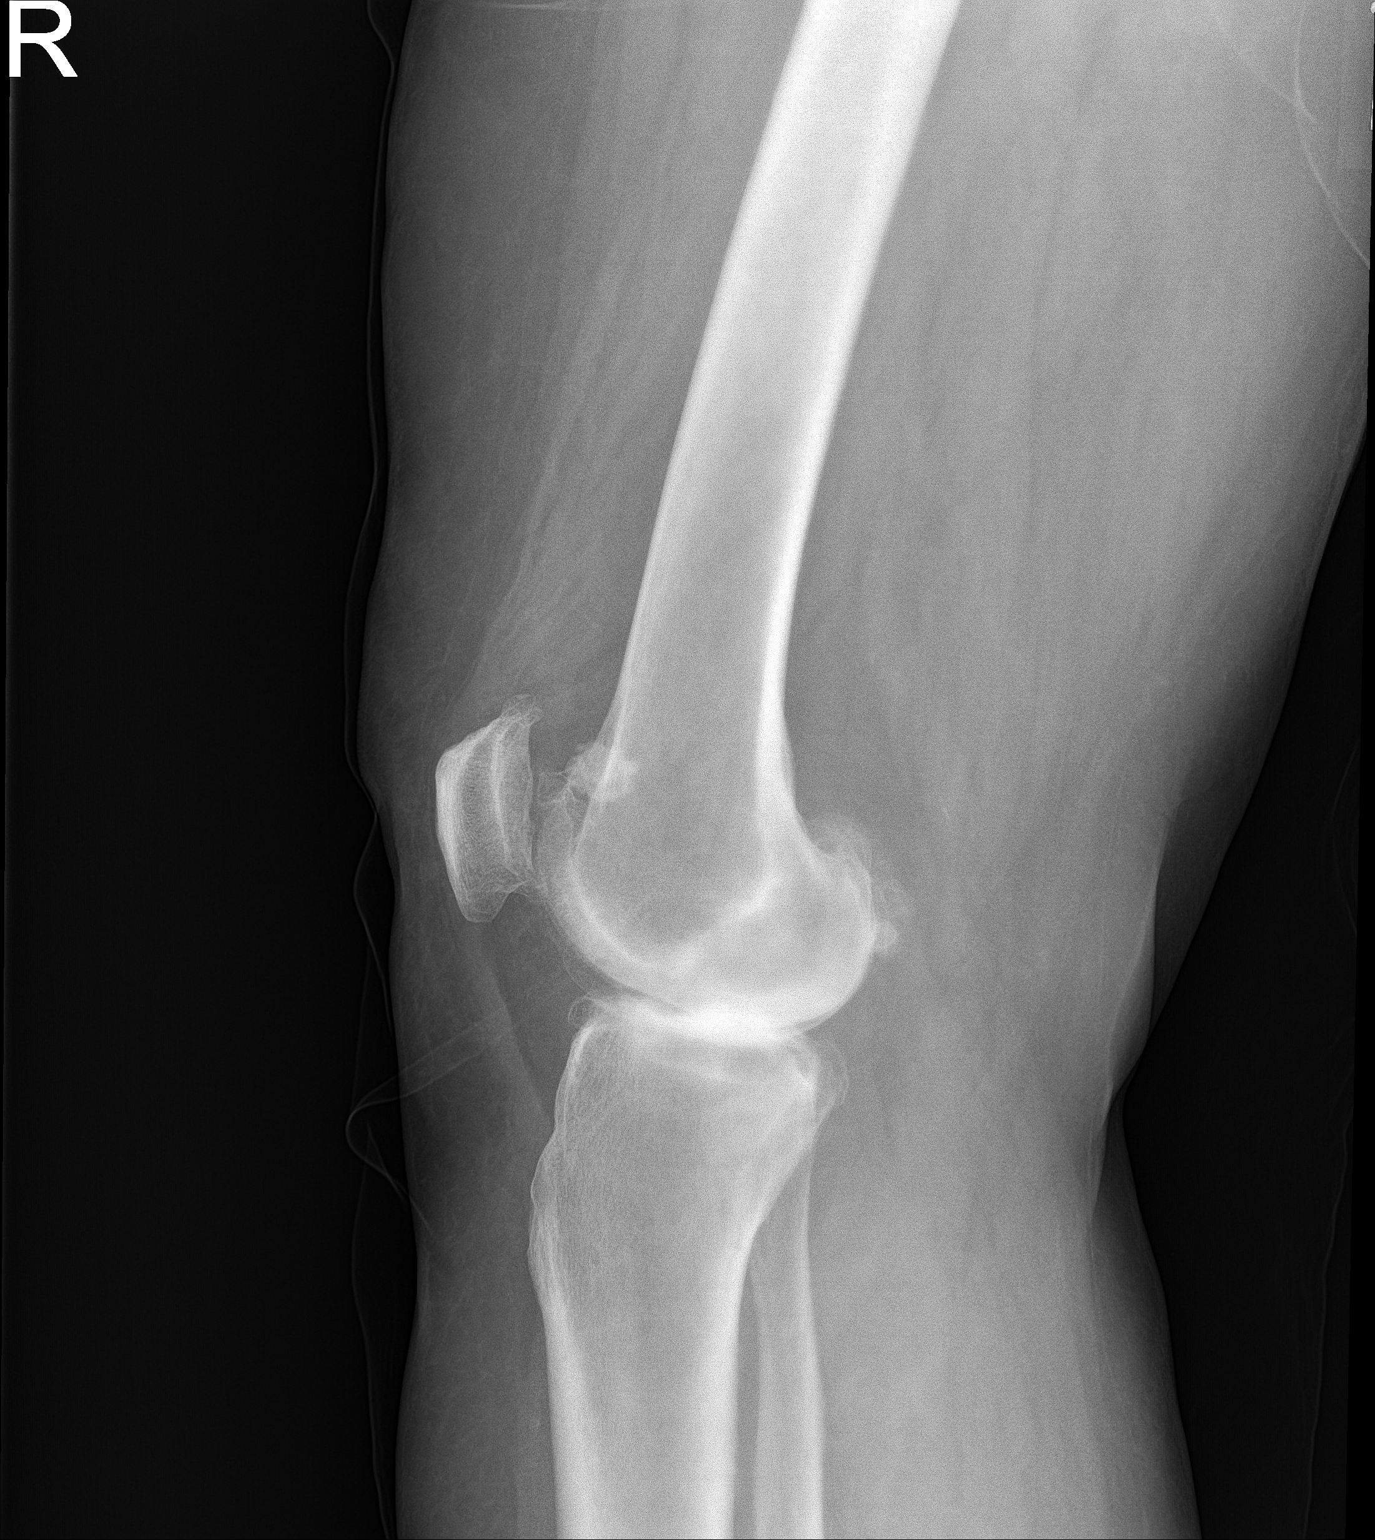

[2 of 2 positions shown; findings below may reference images not displayed]

FINDINGS: No fracture, joint effusion or dislocation. No suspicious focal
osseous lesion. There is a 14 mm round calcific body to the right of
the anterior distal right femoral metaphysis. Moderate to severe
tricompartmental right knee osteoarthritis, most prominent in the
lateral and patellofemoral compartments. Moderate to severe lateral
compartment osteoarthritis in the left knee on the comparison view.
IMPRESSION: Moderate to severe tricompartmental right knee osteoarthritis. Round
14 mm calcific body to the right of the anterior right distal
femoral metaphysis, cannot exclude loose intra-articular body.

## 2019-03-19 MED FILL — DILANTIN 100 MG CAPSULE: 100 | 30 days supply | Qty: 180 | Fill #5

## 2019-03-21 ENCOUNTER — Encounter: Payer: Self-pay | Admitting: Family Medicine

## 2019-04-15 MED FILL — HYDROCHLOROTHIAZIDE 25 MG T: 25 | 90 days supply | Qty: 90 | Fill #1

## 2019-04-18 MED FILL — DILANTIN 100 MG CAPSULE: 100 | 30 days supply | Qty: 180 | Fill #6

## 2019-04-21 MED FILL — ROSUVASTATIN CALCIUM 20 MG: 20 | 90 days supply | Qty: 90 | Fill #2

## 2019-04-21 MED FILL — LEVOTHYROXINE SODIUM 25 MCG: 25 | 90 days supply | Qty: 90 | Fill #2

## 2019-04-21 MED FILL — LISINOPRIL 10 MG TABS: 10 | 90 days supply | Qty: 90 | Fill #2

## 2019-05-05 MED FILL — levETIRAcetam 500 MG TABS: 500 | 90 days supply | Qty: 225 | Fill #2

## 2019-05-19 MED FILL — DILANTIN 100 MG CAPSULE: 100 | 30 days supply | Qty: 180 | Fill #7

## 2019-06-17 MED FILL — DILANTIN 100 MG CAPSULE: 100 | 30 days supply | Qty: 180 | Fill #8

## 2019-06-27 ENCOUNTER — Other Ambulatory Visit: Payer: 59

## 2019-06-27 ENCOUNTER — Other Ambulatory Visit: Payer: Self-pay

## 2019-07-15 ENCOUNTER — Other Ambulatory Visit: Payer: Self-pay | Admitting: Family Medicine

## 2019-07-15 DIAGNOSIS — I10 Essential (primary) hypertension: Secondary | ICD-10-CM

## 2019-07-15 MED FILL — HYDROCHLOROTHIAZIDE 25 MG T: 25 | 30 days supply | Qty: 30 | Fill #0

## 2019-07-15 NOTE — Telephone Encounter (Signed)
30 day supply sent to pharmacy [facility] for further refills

## 2019-07-16 ENCOUNTER — Encounter: Payer: Self-pay | Admitting: Family Medicine

## 2019-07-16 DIAGNOSIS — I10 Essential (primary) hypertension: Secondary | ICD-10-CM

## 2019-07-16 DIAGNOSIS — E782 Mixed hyperlipidemia: Secondary | ICD-10-CM

## 2019-07-17 MED ORDER — LISINOPRIL 10 MG PO TABS: 10.0000 mg | ORAL_TABLET | Freq: Every day | ORAL | 0 refills | Status: DC

## 2019-07-17 MED ORDER — ROSUVASTATIN CALCIUM 20 MG PO TABS: 20.0000 mg | ORAL_TABLET | Freq: Every day | ORAL | 0 refills | Status: DC

## 2019-07-17 MED ORDER — LEVOTHYROXINE SODIUM 25 MCG PO TABS: 25.0000 ug | ORAL_TABLET | Freq: Every day | ORAL | 0 refills | Status: DC

## 2019-07-17 MED FILL — ROSUVASTATIN CALCIUM 20 MG: 20 | 90 days supply | Qty: 90 | Fill #0

## 2019-07-17 MED FILL — DILANTIN 100 MG CAPSULE: 100 | 30 days supply | Qty: 180 | Fill #9

## 2019-07-17 MED FILL — LEVOTHYROXINE SODIUM 25 MCG: 25 | 90 days supply | Qty: 90 | Fill #0

## 2019-07-17 MED FILL — LISINOPRIL 10 MG TABS: 10 | 90 days supply | Qty: 90 | Fill #0

## 2019-07-25 DIAGNOSIS — M1711 Unilateral primary osteoarthritis, right knee: Secondary | ICD-10-CM | POA: Diagnosis not present

## 2019-07-25 DIAGNOSIS — M1712 Unilateral primary osteoarthritis, left knee: Secondary | ICD-10-CM | POA: Diagnosis not present

## 2019-07-25 DIAGNOSIS — M170 Bilateral primary osteoarthritis of knee: Secondary | ICD-10-CM | POA: Diagnosis not present

## 2019-08-16 MED FILL — DILANTIN 100 MG CAPSULE: 100 | 30 days supply | Qty: 180 | Fill #10

## 2019-08-18 MED FILL — levETIRAcetam 500 MG TABS: 500 | 90 days supply | Qty: 225 | Fill #3

## 2019-08-24 ENCOUNTER — Encounter: Payer: Self-pay | Admitting: Family Medicine

## 2019-08-24 DIAGNOSIS — I10 Essential (primary) hypertension: Secondary | ICD-10-CM

## 2019-08-25 MED ORDER — HYDROCHLOROTHIAZIDE 25 MG PO TABS: 25.0000 mg | ORAL_TABLET | Freq: Every day | ORAL | 0 refills | Status: DC

## 2019-08-25 MED FILL — HYDROCHLOROTHIAZIDE 25 MG T: 25 | 90 days supply | Qty: 90 | Fill #1

## 2019-09-02 ENCOUNTER — Other Ambulatory Visit: Payer: Self-pay

## 2019-09-02 ENCOUNTER — Encounter: Payer: Self-pay | Admitting: Family Medicine

## 2019-09-02 ENCOUNTER — Other Ambulatory Visit: Payer: Self-pay | Admitting: Family Medicine

## 2019-09-02 ENCOUNTER — Ambulatory Visit: Payer: 59 | Admitting: Family Medicine

## 2019-09-02 VITALS — BP 115/65 | HR 94 | Temp 98.1°F | Ht 71.0 in | Wt 292.4 lb

## 2019-09-02 DIAGNOSIS — E039 Hypothyroidism, unspecified: Secondary | ICD-10-CM | POA: Diagnosis not present

## 2019-09-02 DIAGNOSIS — E559 Vitamin D deficiency, unspecified: Secondary | ICD-10-CM | POA: Diagnosis not present

## 2019-09-02 DIAGNOSIS — I10 Essential (primary) hypertension: Secondary | ICD-10-CM | POA: Diagnosis not present

## 2019-09-02 DIAGNOSIS — E782 Mixed hyperlipidemia: Secondary | ICD-10-CM

## 2019-09-02 DIAGNOSIS — G40909 Epilepsy, unspecified, not intractable, without status epilepticus: Secondary | ICD-10-CM | POA: Diagnosis not present

## 2019-09-02 LAB — BAYER DCA HB A1C WAIVED: HB A1C (BAYER DCA - WAIVED): 5.6 % (ref <7.0)

## 2019-09-02 MED ORDER — LISINOPRIL 10 MG PO TABS: 10.0000 mg | ORAL_TABLET | Freq: Every day | ORAL | 3 refills | Status: DC

## 2019-09-02 MED ORDER — ROSUVASTATIN CALCIUM 20 MG PO TABS: 20.0000 mg | ORAL_TABLET | Freq: Every day | ORAL | 3 refills | Status: DC

## 2019-09-02 MED ORDER — DOXYCYCLINE HYCLATE 100 MG PO TABS: ORAL_TABLET | ORAL | 0 refills | Status: DC

## 2019-09-02 MED ORDER — HYDROCHLOROTHIAZIDE 25 MG PO TABS: 25.0000 mg | ORAL_TABLET | Freq: Every day | ORAL | 3 refills | Status: DC

## 2019-09-02 MED FILL — DOXYCYCLINE HYCLATE 100 MG: 100 | 10 days supply | Qty: 20 | Fill #0

## 2019-09-02 NOTE — Progress Notes (Signed)
Subjective: CC: Establish care, hypothyroidism, hypertension, hyperlipidemia, seizure disorder PCP: Janora Norlander, DO QDI:YMEBR B [place] is a 59 y.o. female presenting to clinic today for:  1.  Hypertension with hyperlipidemia Patient compliant with lisinopril, hydrochlorothiazide and Crestor.  Does not report chest pain, shortness of breath, visual disturbance  2.  Hypothyroidism, acquired History: No radiation or surgery to the neck.  No known family history of thyroid disorder (patient is adopted) She is compliant with her Synthroid.  Does not report any changes in bowel habit or difficulty swallowing  3.  Seizure disorder Patient is followed by Dr. Jannifer Franklin.  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  Her EEGs have been normal.  She had some breakthrough seizures when she had lost a substantial amount of weight.  She was ultimately put on both Dilantin and Keppra and she has been seizure-free since that time.  Next office visit with neurology is in October   ROS: Per HPI  Allergies  Allergen Reactions   Mushroom Extract Complex Swelling   Penicillins     UNKNOWN REACTION   Shellfish Allergy Swelling   Tamiflu [Oseltamivir Phosphate] Rash    Severe rash, hives   Past Medical History:  Diagnosis Date   Hyperlipidemia    Hypertension    Seizures ([place])     Current Outpatient Medications:    cholecalciferol (VITAMIN D) 1000 UNITS tablet, Take 2,000 Units by mouth daily. , Disp: , Rfl:    DILANTIN 100 MG ER capsule, Take 3 capsules (300 mg total) by mouth 2 (two) times daily., Disp: 180 capsule, Rfl: 11   doxycycline (VIBRA-TABS) 100 MG tablet, doxycycline hyclate 100 mg tablet  TAKE 1 TABLET BY MOUTH TWICE DAILY, Disp: , Rfl:    hydrochlorothiazide (HYDRODIURIL) 25 MG tablet, Take 1 tablet (25 mg total) by mouth daily. Needs appointment for further refills, Disp: 30 tablet, Rfl: 0    ibuprofen (ADVIL,MOTRIN) 200 MG tablet, Take 200 mg by mouth 2 (two) times daily. For pain, Disp: , Rfl:    levETIRAcetam (KEPPRA) 500 MG tablet, Take 1/2 tablet in the AM and 2 tablets in the PM, Disp: 225 tablet, Rfl: 3   levothyroxine (SYNTHROID) 25 MCG tablet, Take 1 tablet (25 mcg total) by mouth daily., Disp: 90 tablet, Rfl: 0   lisinopril (ZESTRIL) 10 MG tablet, Take 1 tablet (10 mg total) by mouth daily., Disp: 90 tablet, Rfl: 0   loratadine (CLARITIN) 10 MG tablet, Take 10 mg by mouth daily., Disp: , Rfl:    Multiple Vitamin (MULTIVITAMIN WITH MINERALS) TABS tablet, Take 1 tablet by mouth daily., Disp: , Rfl:    rosuvastatin (CRESTOR) 20 MG tablet, Take 1 tablet (20 mg total) by mouth daily., Disp: 90 tablet, Rfl: 0 Social History   Socioeconomic History   Marital status: Married    Spouse name: Not on file   Number of children: Not on file   Years of education: Not on file   Highest education level: Not on file  Occupational History    Comment: RN [employer]  Tobacco Use   Smoking status: Never Smoker   Smokeless tobacco: Never Used  Vaping Use   Vaping Use: Never used  Substance and Sexual Activity   Alcohol use: No   Drug use: No   Sexual activity: Yes    Birth control/protection: None  Other Topics Concern   Not on file  Social History Narrative  Not on file   Social Determinants of Health   Financial Resource Strain:    Difficulty of Paying Living Expenses:   Food Insecurity:    Worried About Charity fundraiser in the Last Year:    Arboriculturist in the Last Year:   Transportation Needs:    Film/video editor (Medical):    Lack of Transportation (Non-Medical):   Physical Activity:    Days of Exercise per Week:    Minutes of Exercise per Session:   Stress:    Feeling of Stress :   Social Connections:    Frequency of Communication with Friends and Family:    Frequency of Social Gatherings with Friends and Family:     Attends Religious Services:    Active Member of Clubs or Organizations:    Attends Music therapist:    Marital Status:   Intimate Partner Violence:    Fear of Current or Ex-Partner:    Emotionally Abused:    Physically Abused:    Sexually Abused:    Family History  Problem Relation Age of Onset   Diabetes Other     Objective: Office vital signs reviewed. BP 115/65    Pulse 94    Temp 98.1 F (36.7 C)    Ht '5\' 11"'  (1.803 m)    Wt 292 lb 6.4 oz (132.6 kg)    LMP 03/16/2011    SpO2 97%    BMI 40.78 kg/m   Physical Examination:  General: Awake, alert, morbidly obese, No acute distress HEENT: Normal; no goiter.  No exophthalmos Cardio: regular rate and rhythm, S1S2 heard, no murmurs appreciated Pulm: clear to auscultation bilaterally, no wheezes, rhonchi or rales; normal work of breathing on room air Skin: dry; intact; no rashes or lesions; normal temperature Neuro: No focal neurologic deficits  Assessment/ Plan: 59 y.o. female   1. Essential hypertension Controlled.  Continue current regimen.  Check nonfasting labs - CMP14+EGFR; Future - CMP14+EGFR  2. Mixed hyperlipidemia Check nonfasting lipid panel - Lipid panel; Future - CMP14+EGFR; Future - CMP14+EGFR - Lipid panel  3. Morbid obesity due to excess calories (HCC) - Bayer DCA Hb A1c Waived  4. Acquired hypothyroidism Check thyroid panel - Thyroid Panel With TSH; Future - Thyroid Panel With TSH  5. Vitamin D deficiency - VITAMIN D 25 Hydroxy (Vit-D Deficiency, Fractures); Future - VITAMIN D 25 Hydroxy (Vit-D Deficiency, Fractures)  6. Seizure disorder ([place]) We will CC lab results to her specialist - CBC with Differential; Future - Levetiracetam level; Future - Phenytoin level, free; Future - Phenytoin level, free - Levetiracetam level - CBC with Differential   No orders of the defined types were placed in this encounter.  Meds ordered this encounter  Medications    hydrochlorothiazide (HYDRODIURIL) 25 MG tablet    Sig: Take 1 tablet (25 mg total) by mouth daily.    Dispense:  90 tablet    Refill:  3   lisinopril (ZESTRIL) 10 MG tablet    Sig: Take 1 tablet (10 mg total) by mouth daily.    Dispense:  90 tablet    Refill:  3   rosuvastatin (CRESTOR) 20 MG tablet    Sig: Take 1 tablet (20 mg total) by mouth daily.    Dispense:  90 tablet    Refill:  3   doxycycline (VIBRA-TABS) 100 MG tablet    Sig: TAKE 1 TABLET BY MOUTH TWICE DAILY as directed for bacterial infection.    Dispense:  20 tablet    Refill:  [place], DO [place] [phone]

## 2019-09-02 NOTE — Patient Instructions (Signed)

## 2019-09-04 ENCOUNTER — Encounter: Payer: Self-pay | Admitting: Family Medicine

## 2019-09-04 LAB — LIPID PANEL
Chol/HDL Ratio: 4.1 ratio (ref 0.0–4.4)
Cholesterol, Total: 185 mg/dL (ref 100–199)
HDL: 45 mg/dL (ref >39)
LDL Chol Calc (NIH): 101 mg/dL — ABNORMAL HIGH (ref 0–99)
Triglycerides: 226 mg/dL — ABNORMAL HIGH (ref 0–149)
VLDL Cholesterol Cal: 39 mg/dL (ref 5–40)

## 2019-09-04 LAB — CBC WITH DIFFERENTIAL/PLATELET
Basophils Absolute: 0 10*3/uL (ref 0.0–0.2)
Basos: 0 %
EOS (ABSOLUTE): 0.2 10*3/uL (ref 0.0–0.4)
Eos: 2 %
Hematocrit: 41.4 % (ref 34.0–46.6)
Hemoglobin: 14 g/dL (ref 11.1–15.9)
Immature Grans (Abs): 0 10*3/uL (ref 0.0–0.1)
Immature Granulocytes: 0 %
Lymphocytes Absolute: 2 10*3/uL (ref 0.7–3.1)
Lymphs: 23 %
MCH: 30.4 pg (ref 26.6–33.0)
MCHC: 33.8 g/dL (ref 31.5–35.7)
MCV: 90 fL (ref 79–97)
Monocytes Absolute: 0.5 10*3/uL (ref 0.1–0.9)
Monocytes: 7 %
Neutrophils Absolute: 5.6 10*3/uL (ref 1.4–7.0)
Neutrophils: 68 %
Platelets: 243 10*3/uL (ref 150–450)
RBC: 4.61 x10E6/uL (ref 3.77–5.28)
RDW: 13.7 % (ref 11.7–15.4)
WBC: 8.3 10*3/uL (ref 3.4–10.8)

## 2019-09-04 LAB — CMP14+EGFR
ALT: 22 IU/L (ref 0–32)
AST: 22 IU/L (ref 0–40)
Albumin/Globulin Ratio: 1.7 (ref 1.2–2.2)
Albumin: 4.5 g/dL (ref 3.8–4.9)
Alkaline Phosphatase: 103 IU/L (ref 48–121)
BUN/Creatinine Ratio: 23 (ref 12–28)
BUN: 28 mg/dL — ABNORMAL HIGH (ref 8–27)
Bilirubin Total: <0.2 mg/dL (ref 0.0–1.2)
CO2: 25 mmol/L (ref 20–29)
Calcium: 9.6 mg/dL (ref 8.7–10.3)
Chloride: 100 mmol/L (ref 96–106)
Creatinine, Ser: 1.21 mg/dL — ABNORMAL HIGH (ref 0.57–1.00)
GFR calc Af Amer: 56 mL/min/{1.73_m2} — ABNORMAL LOW (ref >59)
GFR calc non Af Amer: 49 mL/min/{1.73_m2} — ABNORMAL LOW (ref >59)
Globulin, Total: 2.7 g/dL (ref 1.5–4.5)
Glucose: 99 mg/dL (ref 65–99)
Potassium: 4 mmol/L (ref 3.5–5.2)
Sodium: 140 mmol/L (ref 134–144)
Total Protein: 7.2 g/dL (ref 6.0–8.5)

## 2019-09-04 LAB — LEVETIRACETAM LEVEL: Levetiracetam Lvl: 9.4 ug/mL — ABNORMAL LOW (ref 10.0–40.0)

## 2019-09-04 LAB — THYROID PANEL WITH TSH
Free Thyroxine Index: 1.7 (ref 1.2–4.9)
T3 Uptake Ratio: 24 % (ref 24–39)
T4, Total: 7.1 ug/dL (ref 4.5–12.0)
TSH: 2.1 u[IU]/mL (ref 0.450–4.500)

## 2019-09-04 LAB — PHENYTOIN LEVEL, FREE: Phenytoin, Free: 1.2 ug/mL (ref 1.0–2.0)

## 2019-09-04 LAB — VITAMIN D 25 HYDROXY (VIT D DEFICIENCY, FRACTURES): Vit D, 25-Hydroxy: 38.8 ng/mL (ref 30.0–100.0)

## 2019-09-08 ENCOUNTER — Other Ambulatory Visit: Payer: Self-pay | Admitting: Family Medicine

## 2019-09-08 ENCOUNTER — Encounter: Payer: Self-pay | Admitting: Family Medicine

## 2019-09-08 MED ORDER — LEVOTHYROXINE SODIUM 25 MCG PO TABS: 25.0000 ug | ORAL_TABLET | Freq: Every day | ORAL | 3 refills | Status: DC

## 2019-09-10 ENCOUNTER — Encounter: Payer: Self-pay | Admitting: Family Medicine

## 2019-09-15 MED FILL — DILANTIN 100 MG CAPSULE: 100 | 30 days supply | Qty: 180 | Fill #11

## 2019-10-11 ENCOUNTER — Encounter: Payer: Self-pay | Admitting: Family Medicine

## 2019-10-13 ENCOUNTER — Other Ambulatory Visit: Payer: Self-pay | Admitting: Neurology

## 2019-10-13 ENCOUNTER — Other Ambulatory Visit: Payer: Self-pay | Admitting: Family Medicine

## 2019-10-13 MED FILL — DILANTIN 100 MG CAPSULE: 100 | 30 days supply | Qty: 180 | Fill #0

## 2019-10-13 MED FILL — ROSUVASTATIN CALCIUM 20 MG: 20 | 90 days supply | Qty: 90 | Fill #0

## 2019-10-13 MED FILL — LEVOTHYROXINE SODIUM 25 MCG: 25 | 90 days supply | Qty: 90 | Fill #0

## 2019-10-27 MED FILL — LISINOPRIL 10 MG TABS: 10 | 90 days supply | Qty: 90 | Fill #0

## 2019-10-30 ENCOUNTER — Ambulatory Visit: Payer: 59 | Admitting: Neurology

## 2019-11-02 ENCOUNTER — Encounter: Payer: Self-pay | Admitting: Neurology

## 2019-11-03 ENCOUNTER — Other Ambulatory Visit: Payer: Self-pay | Admitting: *Deleted

## 2019-11-03 ENCOUNTER — Other Ambulatory Visit: Payer: Self-pay | Admitting: Neurology

## 2019-11-03 MED ORDER — LEVETIRACETAM 500 MG PO TABS: ORAL_TABLET | ORAL | 0 refills | Status: DC

## 2019-11-03 MED FILL — levETIRAcetam 500 MG TABS: 500 | 90 days supply | Qty: 225 | Fill #0

## 2019-11-07 MED FILL — DILANTIN 100 MG CAPSULE: 100 | 30 days supply | Qty: 180 | Fill #1

## 2019-11-10 DIAGNOSIS — H524 Presbyopia: Secondary | ICD-10-CM | POA: Diagnosis not present

## 2019-11-10 DIAGNOSIS — H5203 Hypermetropia, bilateral: Secondary | ICD-10-CM | POA: Diagnosis not present

## 2019-11-10 DIAGNOSIS — H2513 Age-related nuclear cataract, bilateral: Secondary | ICD-10-CM | POA: Diagnosis not present

## 2019-11-14 ENCOUNTER — Other Ambulatory Visit: Payer: Self-pay

## 2019-11-14 ENCOUNTER — Ambulatory Visit (INDEPENDENT_AMBULATORY_CARE_PROVIDER_SITE_OTHER): Payer: 59 | Admitting: *Deleted

## 2019-11-14 DIAGNOSIS — Z23 Encounter for immunization: Secondary | ICD-10-CM

## 2019-11-14 NOTE — Progress Notes (Signed)
Flu given

## 2019-12-01 MED FILL — HYDROCHLOROTHIAZIDE 25 MG T: 25 | 90 days supply | Qty: 90 | Fill #0

## 2019-12-08 ENCOUNTER — Ambulatory Visit: Payer: 59 | Admitting: Neurology

## 2019-12-08 ENCOUNTER — Encounter: Payer: Self-pay | Admitting: Neurology

## 2019-12-08 ENCOUNTER — Other Ambulatory Visit: Payer: Self-pay | Admitting: Neurology

## 2019-12-08 ENCOUNTER — Other Ambulatory Visit: Payer: Self-pay

## 2019-12-08 VITALS — BP 126/82 | HR 90 | Ht 71.0 in | Wt 282.0 lb

## 2019-12-08 DIAGNOSIS — G40309 Generalized idiopathic epilepsy and epileptic syndromes, not intractable, without status epilepticus: Secondary | ICD-10-CM | POA: Diagnosis not present

## 2019-12-08 MED ORDER — LEVETIRACETAM 500 MG PO TABS: ORAL_TABLET | ORAL | 3 refills | Status: DC

## 2019-12-08 MED FILL — DILANTIN 100 MG CAPSULE: 100 | 30 days supply | Qty: 180 | Fill #2

## 2019-12-08 NOTE — Progress Notes (Signed)
I have read the note, and I agree with the clinical assessment and plan.  Turhan Chill K [place]   

## 2019-12-08 NOTE — Patient Instructions (Signed)
Continue current medications  Call for seizure activity  See you back in 1 year  

## 2019-12-08 NOTE — Progress Notes (Signed)
PATIENT: Maria Rivers DOB: 1960-11-24  REASON FOR VISIT: follow up HISTORY FROM: patient  HISTORY OF PRESENT ILLNESS: Today 12/08/19 Maria Rivers is a 59 year old female history of seizures on Dilantin and Keppra.  Seizures have been well controlled.  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  No seizures in several years, at that time, Keppra was added, she had lost weight on Dilantin.  She has orthopedic issues, bone-on-bone both knees.  Is on vitamin D supplementation.  PCP check blood work in August 2021, Free Dilantin level was 1.2, Keppra level 9.4.  Presents today for evaluation unaccompanied.  HISTORY  10/24/2018 Dr. Anne Hahn: Maria Rivers is a 59 year old right-handed white female with a history of seizures that have been under relatively good control with the use of Dilantin and Keppra.  The patient is on vitamin D supplementation.  She has had recent blood levels done with a therapeutic Keppra level of 27.0 and a free Dilantin level of 1.1 that is therapeutic.  The patient tolerates the medications well.  She is able to operate a motor vehicle without difficulty.  She is followed through orthopedic surgery for her knee arthritis and requires injections on occasion.  She returns to the office today for an evaluation.  REVIEW OF SYSTEMS: Out of a complete 14 system review of symptoms, the patient complains only of the following symptoms, and all other reviewed systems are negative.  Seizures  ALLERGIES: Allergies  Allergen Reactions   Mushroom Extract Complex Swelling   Penicillins     UNKNOWN REACTION   Shellfish Allergy Swelling   Tamiflu [Oseltamivir Phosphate] Rash    Severe rash, hives    HOME MEDICATIONS: Outpatient Medications Prior to Visit  Medication Sig Dispense Refill   cholecalciferol (VITAMIN D) 1000 UNITS tablet Take 2,000 Units by mouth daily.      DILANTIN 100 MG ER capsule TAKE 3 CAPSULES BY MOUTH 2 TIMES DAILY. 180 capsule 11    doxycycline (VIBRA-TABS) 100 MG tablet TAKE 1 TABLET BY MOUTH TWICE DAILY as directed for bacterial infection. 20 tablet 0   hydrochlorothiazide (HYDRODIURIL) 25 MG tablet Take 1 tablet (25 mg total) by mouth daily. 90 tablet 3   ibuprofen (ADVIL,MOTRIN) 200 MG tablet Take 200 mg by mouth 2 (two) times daily. For pain     levothyroxine (SYNTHROID) 25 MCG tablet TAKE 1 TABLET (25 MCG TOTAL) BY MOUTH DAILY. 90 tablet 3   lisinopril (ZESTRIL) 10 MG tablet Take 1 tablet (10 mg total) by mouth daily. 90 tablet 3   loratadine (CLARITIN) 10 MG tablet Take 10 mg by mouth daily.     Multiple Vitamin (MULTIVITAMIN WITH MINERALS) TABS tablet Take 1 tablet by mouth daily.     rosuvastatin (CRESTOR) 20 MG tablet Take 1 tablet (20 mg total) by mouth daily. 90 tablet 3   levETIRAcetam (KEPPRA) 500 MG tablet Take 1/2 tablet in the AM and 2 tablets in the PM 225 tablet 0   No facility-administered medications prior to visit.    PAST MEDICAL HISTORY: Past Medical History:  Diagnosis Date   Hyperlipidemia    Hypertension    Seizures (HCC)     PAST SURGICAL HISTORY: History reviewed. No pertinent surgical history.  FAMILY HISTORY: Family History  Problem Relation Age of Onset   Diabetes Other     SOCIAL HISTORY: Social History   Socioeconomic History   Marital status: Married    Spouse name: Not on file  Number of children: Not on file   Years of education: Not on file   Highest education level: Not on file  Occupational History    Comment: RN [employer]  Tobacco Use   Smoking status: Never Smoker   Smokeless tobacco: Never Used  Vaping Use   Vaping Use: Never used  Substance and Sexual Activity   Alcohol use: No   Drug use: No   Sexual activity: Yes    Birth control/protection: None  Other Topics Concern   Not on file  Social History Narrative   Lives at home with husband and 2 cats   Right handed   Caffeine: avg 2 cups/day diet mtn dew (no coffee)   Social  Determinants of Health   Financial Resource Strain:    Difficulty of Paying Living Expenses: Not on file  Food Insecurity:    Worried About Programme researcher, broadcasting/film/video in the Last Year: Not on file   [employer] of Food in the Last Year: Not on file  Transportation Needs:    Lack of Transportation (Medical): Not on file   Lack of Transportation (Non-Medical): Not on file  Physical Activity:    Days of Exercise per Week: Not on file   Minutes of Exercise per Session: Not on file  Stress:    Feeling of Stress : Not on file  Social Connections:    Frequency of Communication with Friends and Family: Not on file   Frequency of Social Gatherings with Friends and Family: Not on file   Attends Religious Services: Not on file   Active Member of Clubs or Organizations: Not on file   Attends Banker Meetings: Not on file   Marital Status: Not on file  Intimate Partner Violence:    Fear of Current or Ex-Partner: Not on file   Emotionally Abused: Not on file   Physically Abused: Not on file   Sexually Abused: Not on file   PHYSICAL EXAM  Vitals:   12/08/19 1447  BP: 126/82  Pulse: 90  SpO2: 96%  Weight: 282 lb (127.9 kg)  Height: 5\' 11"  (1.803 m)   Body mass index is 39.33 kg/m.  Generalized: Well developed, in no acute distress   Neurological examination  Mentation: Alert oriented to time, place, history taking. Follows all commands speech and language fluent Cranial nerve II-XII: Pupils were equal round reactive to light. Extraocular movements were full, visual field were full on confrontational test. Facial sensation and strength were normal. Head turning and shoulder shrug  were normal and symmetric. Motor: The motor testing reveals 5 over 5 strength of all 4 extremities. Good symmetric motor tone is noted throughout.  Sensory: Sensory testing is intact to soft touch on all 4 extremities. No evidence of extinction is noted.  Coordination: Cerebellar testing  reveals good finger-nose-finger and heel-to-shin bilaterally.  Gait and station: Gait is slightly wide-based, antalgic. Reflexes: Deep tendon reflexes are symmetric  DIAGNOSTIC DATA (LABS, IMAGING, TESTING) - I reviewed patient records, labs, notes, testing and imaging myself where available.  Lab Results  Component Value Date   WBC 8.3 09/02/2019   HGB 14.0 09/02/2019   HCT 41.4 09/02/2019   MCV 90 09/02/2019   PLT 243 09/02/2019      Component Value Date/Time   NA 140 09/02/2019 1610   K 4.0 09/02/2019 1610   CL 100 09/02/2019 1610   CO2 25 09/02/2019 1610   GLUCOSE 99 09/02/2019 1610   BUN 28 (H) 09/02/2019 1610  CREATININE 1.21 (H) 09/02/2019 1610   CALCIUM 9.6 09/02/2019 1610   PROT 7.2 09/02/2019 1610   ALBUMIN 4.5 09/02/2019 1610   AST 22 09/02/2019 1610   ALT 22 09/02/2019 1610   ALKPHOS 103 09/02/2019 1610   BILITOT <0.2 09/02/2019 1610   GFRNONAA 49 (L) 09/02/2019 1610   GFRAA 56 (L) 09/02/2019 1610   Lab Results  Component Value Date   CHOL 185 09/02/2019   HDL 45 09/02/2019   LDLCALC 101 (H) 09/02/2019   TRIG 226 (H) 09/02/2019   CHOLHDL 4.1 09/02/2019   Lab Results  Component Value Date   HGBA1C 5.6 09/02/2019   No results found for: VITAMINB12 Lab Results  Component Value Date   TSH 2.100 09/02/2019    ASSESSMENT AND PLAN 59 y.o. year old female  has a past medical history of Hyperlipidemia, Hypertension, and Seizures (HCC). here with:  1.  History of seizures, well controlled  She continues to do well.  She will remain on Dilantin and Keppra.  Her PCP follows routine blood work.  She will call for recurrent seizure, follow-up in 1 year or sooner if needed.  I spent 20 minutes of face-to-face and non-face-to-face time with patient.  This included previsit chart review, lab review, study review, order entry, electronic health record documentation, patient education.  Margie Ege, AGNP-C, DNP 12/08/2019, 3:18 PM [facility] [address], [address] [place], [place] [address] [phone]

## 2020-01-05 MED FILL — DILANTIN 100 MG CAPSULE: 100 | 30 days supply | Qty: 180 | Fill #3

## 2020-01-19 MED FILL — ROSUVASTATIN CALCIUM 20 MG: 20 | 90 days supply | Qty: 90 | Fill #1

## 2020-01-19 MED FILL — LEVOTHYROXINE SODIUM 25 MCG: 25 | 90 days supply | Qty: 90 | Fill #1

## 2020-01-19 MED FILL — LISINOPRIL 10 MG TABS: 10 | 90 days supply | Qty: 90 | Fill #1

## 2020-02-01 ENCOUNTER — Encounter: Payer: Self-pay | Admitting: Neurology

## 2020-02-02 MED FILL — levETIRAcetam 500 MG TABS: 500 | 90 days supply | Qty: 225 | Fill #0

## 2020-02-04 MED FILL — DILANTIN 100 MG CAPSULE: 100 | 30 days supply | Qty: 180 | Fill #4

## 2020-02-04 NOTE — Telephone Encounter (Signed)
Spoke with Maxine Glenn at [facility] outpatient pharmacy and was notified that Keppra was filled on 1/17 and they have 2 refills left.

## 2020-03-02 MED FILL — HYDROCHLOROTHIAZIDE 25 MG T: 25 | 90 days supply | Qty: 90 | Fill #1

## 2020-03-05 MED FILL — DILANTIN 100 MG CAPSULE: 100 | 30 days supply | Qty: 180 | Fill #5

## 2020-03-09 ENCOUNTER — Encounter: Payer: Self-pay | Admitting: Family Medicine

## 2020-03-25 DIAGNOSIS — M25562 Pain in left knee: Secondary | ICD-10-CM | POA: Diagnosis not present

## 2020-03-25 DIAGNOSIS — M170 Bilateral primary osteoarthritis of knee: Secondary | ICD-10-CM | POA: Diagnosis not present

## 2020-04-05 MED FILL — DILANTIN 100 MG CAPSULE: 100 | 30 days supply | Qty: 180 | Fill #6

## 2020-04-09 ENCOUNTER — Other Ambulatory Visit (HOSPITAL_BASED_OUTPATIENT_CLINIC_OR_DEPARTMENT_OTHER): Payer: Self-pay

## 2020-04-12 ENCOUNTER — Encounter: Payer: Self-pay | Admitting: Neurology

## 2020-04-12 ENCOUNTER — Encounter: Payer: Self-pay | Admitting: Family Medicine

## 2020-04-12 MED FILL — LISINOPRIL 10 MG TABS: 10 | 90 days supply | Qty: 90 | Fill #2

## 2020-04-12 MED FILL — LEVOTHYROXINE SODIUM 25 MCG: 25 | 90 days supply | Qty: 90 | Fill #2

## 2020-04-12 MED FILL — ROSUVASTATIN CALCIUM 20 MG: 20 | 90 days supply | Qty: 90 | Fill #2

## 2020-04-14 MED FILL — levETIRAcetam 500 MG TABS: 500 | 90 days supply | Qty: 225 | Fill #1

## 2020-05-05 ENCOUNTER — Encounter: Payer: Self-pay | Admitting: Family Medicine

## 2020-05-22 MED FILL — Phenytoin Sodium Extended Cap 100 MG: ORAL | 30 days supply | Qty: 180 | Fill #0 | Status: AC

## 2020-05-24 ENCOUNTER — Other Ambulatory Visit (HOSPITAL_COMMUNITY): Payer: Self-pay

## 2020-05-25 ENCOUNTER — Other Ambulatory Visit (HOSPITAL_COMMUNITY): Payer: Self-pay

## 2020-05-26 ENCOUNTER — Other Ambulatory Visit (HOSPITAL_COMMUNITY): Payer: Self-pay

## 2020-05-29 MED FILL — Hydrochlorothiazide Tab 25 MG: ORAL | 90 days supply | Qty: 90 | Fill #0 | Status: AC

## 2020-05-31 ENCOUNTER — Other Ambulatory Visit (HOSPITAL_COMMUNITY): Payer: Self-pay

## 2020-06-11 ENCOUNTER — Other Ambulatory Visit (HOSPITAL_COMMUNITY): Payer: Self-pay

## 2020-06-11 MED FILL — Phenytoin Sodium Extended Cap 100 MG: ORAL | 30 days supply | Qty: 180 | Fill #1 | Status: AC

## 2020-06-21 ENCOUNTER — Other Ambulatory Visit (HOSPITAL_COMMUNITY): Payer: Self-pay

## 2020-06-29 ENCOUNTER — Encounter: Payer: Self-pay | Admitting: Family Medicine

## 2020-07-06 MED FILL — Rosuvastatin Calcium Tab 20 MG: ORAL | 90 days supply | Qty: 90 | Fill #0 | Status: AC

## 2020-07-06 MED FILL — Levothyroxine Sodium Tab 25 MCG: ORAL | 90 days supply | Qty: 90 | Fill #0 | Status: AC

## 2020-07-06 MED FILL — Phenytoin Sodium Extended Cap 100 MG: ORAL | 30 days supply | Qty: 180 | Fill #2 | Status: CN

## 2020-07-07 ENCOUNTER — Other Ambulatory Visit (HOSPITAL_COMMUNITY): Payer: Self-pay

## 2020-07-10 MED FILL — Levetiracetam Tab 500 MG: ORAL | 90 days supply | Qty: 225 | Fill #0 | Status: AC

## 2020-07-10 MED FILL — Lisinopril Tab 10 MG: ORAL | 90 days supply | Qty: 90 | Fill #0 | Status: AC

## 2020-07-12 ENCOUNTER — Other Ambulatory Visit (HOSPITAL_COMMUNITY): Payer: Self-pay

## 2020-07-13 ENCOUNTER — Other Ambulatory Visit (HOSPITAL_COMMUNITY): Payer: Self-pay

## 2020-07-22 ENCOUNTER — Other Ambulatory Visit (HOSPITAL_COMMUNITY): Payer: Self-pay

## 2020-07-22 MED FILL — Phenytoin Sodium Extended Cap 100 MG: ORAL | 25 days supply | Qty: 150 | Fill #2 | Status: AC

## 2020-07-22 MED FILL — Phenytoin Sodium Extended Cap 100 MG: ORAL | 5 days supply | Qty: 30 | Fill #2 | Status: AC

## 2020-07-23 ENCOUNTER — Other Ambulatory Visit (HOSPITAL_COMMUNITY): Payer: Self-pay

## 2020-08-13 MED FILL — Phenytoin Sodium Extended Cap 100 MG: ORAL | 30 days supply | Qty: 180 | Fill #3 | Status: AC

## 2020-08-14 ENCOUNTER — Other Ambulatory Visit (HOSPITAL_COMMUNITY): Payer: Self-pay

## 2020-08-17 ENCOUNTER — Other Ambulatory Visit (HOSPITAL_COMMUNITY): Payer: Self-pay

## 2020-08-22 MED FILL — Hydrochlorothiazide Tab 25 MG: ORAL | 90 days supply | Qty: 90 | Fill #1 | Status: AC

## 2020-08-23 ENCOUNTER — Other Ambulatory Visit (HOSPITAL_COMMUNITY): Payer: Self-pay

## 2020-09-17 MED FILL — Phenytoin Sodium Extended Cap 100 MG: ORAL | 30 days supply | Qty: 180 | Fill #4 | Status: AC

## 2020-09-18 ENCOUNTER — Other Ambulatory Visit (HOSPITAL_COMMUNITY): Payer: Self-pay

## 2020-09-21 ENCOUNTER — Other Ambulatory Visit (HOSPITAL_COMMUNITY): Payer: Self-pay

## 2020-09-24 ENCOUNTER — Encounter: Payer: Self-pay | Admitting: Family Medicine

## 2020-10-02 ENCOUNTER — Other Ambulatory Visit: Payer: Self-pay | Admitting: Family Medicine

## 2020-10-02 ENCOUNTER — Other Ambulatory Visit (HOSPITAL_COMMUNITY): Payer: Self-pay

## 2020-10-02 DIAGNOSIS — I10 Essential (primary) hypertension: Secondary | ICD-10-CM

## 2020-10-02 DIAGNOSIS — E782 Mixed hyperlipidemia: Secondary | ICD-10-CM

## 2020-10-02 MED FILL — Levetiracetam Tab 500 MG: ORAL | 90 days supply | Qty: 225 | Fill #1 | Status: AC

## 2020-10-04 ENCOUNTER — Encounter: Payer: Self-pay | Admitting: Family Medicine

## 2020-10-04 ENCOUNTER — Other Ambulatory Visit (HOSPITAL_COMMUNITY): Payer: Self-pay

## 2020-10-04 DIAGNOSIS — E782 Mixed hyperlipidemia: Secondary | ICD-10-CM

## 2020-10-04 DIAGNOSIS — I10 Essential (primary) hypertension: Secondary | ICD-10-CM

## 2020-10-05 ENCOUNTER — Other Ambulatory Visit (HOSPITAL_COMMUNITY): Payer: Self-pay

## 2020-10-05 ENCOUNTER — Encounter: Payer: Self-pay | Admitting: Family Medicine

## 2020-10-07 ENCOUNTER — Encounter: Payer: Self-pay | Admitting: Family Medicine

## 2020-10-15 ENCOUNTER — Encounter: Payer: Self-pay | Admitting: Neurology

## 2020-10-15 ENCOUNTER — Other Ambulatory Visit (HOSPITAL_COMMUNITY): Payer: Self-pay

## 2020-10-15 ENCOUNTER — Other Ambulatory Visit: Payer: Self-pay | Admitting: Neurology

## 2020-10-16 ENCOUNTER — Other Ambulatory Visit (HOSPITAL_COMMUNITY): Payer: Self-pay

## 2020-10-18 ENCOUNTER — Other Ambulatory Visit: Payer: Self-pay | Admitting: *Deleted

## 2020-10-18 ENCOUNTER — Other Ambulatory Visit (HOSPITAL_COMMUNITY): Payer: Self-pay

## 2020-10-18 DIAGNOSIS — G40309 Generalized idiopathic epilepsy and epileptic syndromes, not intractable, without status epilepticus: Secondary | ICD-10-CM

## 2020-10-18 MED ORDER — PHENYTOIN SODIUM EXTENDED 100 MG PO CAPS
300.0000 mg | ORAL_CAPSULE | Freq: Two times a day (BID) | ORAL | 3 refills | Status: DC
  Filled 2020-10-18: qty 180, 30d supply, fill #0

## 2020-10-18 MED ORDER — PHENYTOIN SODIUM EXTENDED 100 MG PO CAPS
300.0000 mg | ORAL_CAPSULE | Freq: Two times a day (BID) | ORAL | 3 refills | Status: DC
  Filled 2020-10-18: qty 180, 30d supply, fill #0
  Filled 2020-11-19: qty 162, 27d supply, fill #1
  Filled 2020-11-19: qty 18, 3d supply, fill #1
  Filled 2020-12-17: qty 180, 30d supply, fill #2
  Filled 2021-01-11: qty 180, 30d supply, fill #3

## 2020-10-18 MED ORDER — DILANTIN 100 MG PO CAPS: ORAL_CAPSULE | ORAL | 3 refills | Status: DC

## 2020-10-19 ENCOUNTER — Other Ambulatory Visit (HOSPITAL_COMMUNITY): Payer: Self-pay

## 2020-10-20 ENCOUNTER — Other Ambulatory Visit (HOSPITAL_COMMUNITY): Payer: Self-pay

## 2020-10-25 ENCOUNTER — Other Ambulatory Visit (HOSPITAL_COMMUNITY): Payer: Self-pay

## 2020-10-25 ENCOUNTER — Encounter: Payer: Self-pay | Admitting: Family Medicine

## 2020-10-25 DIAGNOSIS — I10 Essential (primary) hypertension: Secondary | ICD-10-CM

## 2020-10-25 MED ORDER — HYDROCHLOROTHIAZIDE 25 MG PO TABS
ORAL_TABLET | Freq: Every day | ORAL | 0 refills | Status: DC
  Filled 2020-10-25: qty 30, fill #0

## 2020-10-25 MED ORDER — LISINOPRIL 10 MG PO TABS
ORAL_TABLET | Freq: Every day | ORAL | 0 refills | Status: DC
  Filled 2020-10-25: qty 30, 30d supply, fill #0

## 2020-11-08 ENCOUNTER — Other Ambulatory Visit: Payer: Self-pay

## 2020-11-08 ENCOUNTER — Other Ambulatory Visit (HOSPITAL_COMMUNITY): Payer: Self-pay

## 2020-11-08 ENCOUNTER — Ambulatory Visit: Payer: 59 | Admitting: Family Medicine

## 2020-11-08 ENCOUNTER — Encounter: Payer: Self-pay | Admitting: Family Medicine

## 2020-11-08 VITALS — BP 136/82 | HR 86 | Temp 97.3°F | Ht 71.0 in | Wt 290.6 lb

## 2020-11-08 DIAGNOSIS — E039 Hypothyroidism, unspecified: Secondary | ICD-10-CM

## 2020-11-08 DIAGNOSIS — E782 Mixed hyperlipidemia: Secondary | ICD-10-CM

## 2020-11-08 DIAGNOSIS — Z0001 Encounter for general adult medical examination with abnormal findings: Secondary | ICD-10-CM

## 2020-11-08 DIAGNOSIS — I10 Essential (primary) hypertension: Secondary | ICD-10-CM | POA: Diagnosis not present

## 2020-11-08 DIAGNOSIS — E559 Vitamin D deficiency, unspecified: Secondary | ICD-10-CM

## 2020-11-08 DIAGNOSIS — Z0000 Encounter for general adult medical examination without abnormal findings: Secondary | ICD-10-CM

## 2020-11-08 DIAGNOSIS — G40909 Epilepsy, unspecified, not intractable, without status epilepticus: Secondary | ICD-10-CM

## 2020-11-08 MED ORDER — ROSUVASTATIN CALCIUM 20 MG PO TABS
ORAL_TABLET | Freq: Every day | ORAL | 3 refills | Status: DC
  Filled 2020-11-08: qty 90, 90d supply, fill #0
  Filled 2021-01-22: qty 90, 90d supply, fill #1
  Filled 2021-04-30: qty 90, 90d supply, fill #2
  Filled 2021-07-30: qty 90, 90d supply, fill #3

## 2020-11-08 MED ORDER — LISINOPRIL 10 MG PO TABS
ORAL_TABLET | Freq: Every day | ORAL | 3 refills | Status: DC
  Filled 2020-11-08 – 2020-11-19 (×2): qty 90, 90d supply, fill #0
  Filled 2021-02-27: qty 90, 90d supply, fill #1
  Filled 2021-05-14: qty 90, 90d supply, fill #2
  Filled 2021-08-13: qty 90, 90d supply, fill #3

## 2020-11-08 MED ORDER — LEVOTHYROXINE SODIUM 25 MCG PO TABS
ORAL_TABLET | Freq: Every day | ORAL | 3 refills | Status: DC
  Filled 2020-11-08: qty 90, 90d supply, fill #0
  Filled 2021-01-22: qty 90, 90d supply, fill #1
  Filled 2021-04-30: qty 90, 90d supply, fill #2
  Filled 2021-07-30: qty 90, 90d supply, fill #3

## 2020-11-08 MED ORDER — HYDROCHLOROTHIAZIDE 25 MG PO TABS
ORAL_TABLET | Freq: Every day | ORAL | 3 refills | Status: DC
Start: 2020-11-08 — End: 2021-11-07
  Filled 2020-11-08: qty 90, 90d supply, fill #0
  Filled 2021-01-22: qty 90, 90d supply, fill #1
  Filled 2021-04-30: qty 90, 90d supply, fill #2
  Filled 2021-08-13: qty 90, 90d supply, fill #3

## 2020-11-08 NOTE — Progress Notes (Signed)
Amorita B Morning is a 60 y.o. female presents to office today for annual physical exam examination.    Concerns today include: 1. None. Has knee pain but scheduled to see her ortho soon for shot.  Occupation: works with [employer] center  Diet: fair, Exercise: no structured Last colonoscopy: needs but declines Last mammogram: needs Last pap smear: n/a Refills needed today: all Immunizations needed: PNA, shingles Immunization History  Administered Date(s) Administered   Influenza,inj,Quad PF,6+ Mos 10/18/2018, 11/14/2019, 10/06/2020   Influenza,inj,quad, With Preservative 11/02/2017   Influenza-Unspecified 10/20/2016, 11/02/2017   Moderna Sars-Covid-2 Vaccination 01/13/2019, 02/11/2019, 09/10/2019     Past Medical History:  Diagnosis Date   Hyperlipidemia    Hypertension    Seizures ([place])    Social History   Socioeconomic History   Marital status: Married    Spouse name: Not on file   Number of children: Not on file   Years of education: Not on file   Highest education level: Not on file  Occupational History    Comment: RN [employer]  Tobacco Use   Smoking status: Never   Smokeless tobacco: Never  Vaping Use   Vaping Use: Never used  Substance and Sexual Activity   Alcohol use: No   Drug use: No   Sexual activity: Yes    Birth control/protection: None  Other Topics Concern   Not on file  Social History Narrative   Lives at home with husband and 2 cats   Right handed   Caffeine: avg 2 cups/day diet mtn dew (no coffee)   Social Determinants of Health   Financial Resource Strain: Not on file  Food Insecurity: Not on file  Transportation Needs: Not on file  Physical Activity: Not on file  Stress: Not on file  Social Connections: Not on file  Intimate Partner Violence: Not on file   History reviewed. No pertinent surgical history. Family History  Problem Relation Age of Onset   Diabetes Other     Current Outpatient Medications:    cholecalciferol (VITAMIN D)  1000 UNITS tablet, Take 2,000 Units by mouth daily. , Disp: , Rfl:    DILANTIN 100 MG ER capsule, TAKE 3 CAPSULES BY MOUTH 2 TIMES DAILY., Disp: 180 capsule, Rfl: 3   doxycycline (VIBRA-TABS) 100 MG tablet, TAKE 1 TABLET BY MOUTH TWICE DAILY as directed for bacterial infection., Disp: 20 tablet, Rfl: 0   ibuprofen (ADVIL,MOTRIN) 200 MG tablet, Take 200 mg by mouth 2 (two) times daily. For pain, Disp: , Rfl:    levETIRAcetam (KEPPRA) 500 MG tablet, TAKE 1/2 TABLET BY MOUTH IN THE MORNING AND 2 TABLETS IN THE EVENING, Disp: 225 tablet, Rfl: 3   loratadine (CLARITIN) 10 MG tablet, Take 10 mg by mouth daily., Disp: , Rfl:    Multiple Vitamin (MULTIVITAMIN WITH MINERALS) TABS tablet, Take 1 tablet by mouth daily., Disp: , Rfl:    phenytoin (DILANTIN) 100 MG ER capsule, Take 3 capsules (300 mg total) by mouth 2 (two) times daily., Disp: 180 capsule, Rfl: 3   hydrochlorothiazide (HYDRODIURIL) 25 MG tablet, TAKE 1 TABLET BY MOUTH ONCE DAILY, Disp: 90 tablet, Rfl: 3   levothyroxine (SYNTHROID) 25 MCG tablet, TAKE 1 TABLET BY MOUTH ONCE DAILY, Disp: 90 tablet, Rfl: 3   lisinopril (ZESTRIL) 10 MG tablet, TAKE 1 TABLET BY MOUTH ONCE DAILY, Disp: 90 tablet, Rfl: 3   rosuvastatin (CRESTOR) 20 MG tablet, TAKE 1 TABLET BY MOUTH ONCE DAILY, Disp: 90 tablet, Rfl: 3  Allergies  Allergen Reactions  Mushroom Extract Complex Swelling   Penicillins     UNKNOWN REACTION   Shellfish Allergy Swelling   Tamiflu [Oseltamivir Phosphate] Rash    Severe rash, hives     ROS: Review of Systems Pertinent items noted in HPI and remainder of comprehensive ROS otherwise negative.    Physical exam BP 136/82   Pulse 86   Temp (!) 97.3 F (36.3 C)   Ht '5\' 11"'  (1.803 m)   Wt 290 lb 9.6 oz (131.8 kg)   LMP 03/16/2011   SpO2 97%   BMI 40.53 kg/m  General appearance: alert, cooperative, appears stated age, no distress, and morbidly obese Head: Normocephalic, without obvious abnormality, atraumatic Eyes: negative  findings: lids and lashes normal, conjunctivae and sclerae normal, corneas clear, and pupils equal, round, reactive to light and accomodation Ears: normal TM's and external ear canals both ears Nose: Nares normal. Septum midline. Mucosa normal. No drainage or sinus tenderness. Throat: lips, mucosa, and tongue normal; teeth and gums normal Neck: no adenopathy, supple, symmetrical, trachea midline, and thyroid not enlarged, symmetric, no tenderness/mass/nodules Back: symmetric, no curvature. ROM normal. No CVA tenderness. Lungs: clear to auscultation bilaterally Heart: regular rate and rhythm, S1, S2 normal, no murmur, click, rub or gallop Abdomen: soft, non-tender; bowel sounds normal; no masses,  no organomegaly and obese Extremities:  edema present Pulses: 2+ and symmetric Skin:  skin tag on left side of neck Lymph nodes: Cervical, supraclavicular, and axillary nodes normal. Neurologic: Grossly normal Psych: mood stable    Assessment/ Plan: Yudit B Overby here for annual physical exam.   Annual physical exam  Essential hypertension - Plan: hydrochlorothiazide (HYDRODIURIL) 25 MG tablet, lisinopril (ZESTRIL) 10 MG tablet, CMP14+EGFR, Hemoglobin A1c, CANCELED: CMP14+EGFR, CANCELED: CMP14+EGFR  Mixed hyperlipidemia - Plan: rosuvastatin (CRESTOR) 20 MG tablet, CMP14+EGFR, Lipid panel, Hemoglobin A1c, CANCELED: CMP14+EGFR, CANCELED: LDL Cholesterol, Direct, CANCELED: CMP14+EGFR, CANCELED: Lipid panel  Vitamin D deficiency - Plan: VITAMIN D 25 Hydroxy (Vit-D Deficiency, Fractures), CANCELED: VITAMIN D 25 Hydroxy (Vit-D Deficiency, Fractures), CANCELED: VITAMIN D 25 Hydroxy (Vit-D Deficiency, Fractures)  Acquired hypothyroidism - Plan: levothyroxine (SYNTHROID) 25 MCG tablet, TSH, T4, Free, CANCELED: TSH, CANCELED: T4, Free, CANCELED: TSH, CANCELED: T4, Free  Morbid obesity due to excess calories (HCC) - Plan: Hemoglobin A1c  Seizure disorder ([place]) - Plan: CBC, CMP14+EGFR, Levetiracetam  level, Phenytoin level, free, CANCELED: CMP14+EGFR, CANCELED: CBC, CANCELED: CMP14+EGFR, CANCELED: CBC, CANCELED: Phenytoin level, free, CANCELED: Levetiracetam level  Needs colon cancer screening, PNA vaccine, Shingles vaccine  BP controlled.  CMP ordered, Meds renewed  Will get FLP done.  Crestor renewed  Check A1c, Vit D levels  Asymptomatic from a thyroid standpoint, labs ordered, meds renewed  Continue f/u with neuro. Labs ordered and cc'd to neuro.  Patient to follow up in 1 year for annual exam or sooner if needed.  Agron Swiney M. Lajuana Ripple, DO

## 2020-11-09 ENCOUNTER — Encounter: Payer: Self-pay | Admitting: Family Medicine

## 2020-11-19 ENCOUNTER — Other Ambulatory Visit (HOSPITAL_COMMUNITY): Payer: Self-pay

## 2020-11-22 ENCOUNTER — Other Ambulatory Visit (HOSPITAL_COMMUNITY): Payer: Self-pay

## 2020-11-29 ENCOUNTER — Ambulatory Visit: Payer: 59 | Admitting: Neurology

## 2020-11-30 ENCOUNTER — Ambulatory Visit: Payer: 59 | Admitting: Neurology

## 2020-12-07 DIAGNOSIS — H524 Presbyopia: Secondary | ICD-10-CM | POA: Diagnosis not present

## 2020-12-07 DIAGNOSIS — H2513 Age-related nuclear cataract, bilateral: Secondary | ICD-10-CM | POA: Diagnosis not present

## 2020-12-07 DIAGNOSIS — H52223 Regular astigmatism, bilateral: Secondary | ICD-10-CM | POA: Diagnosis not present

## 2020-12-07 DIAGNOSIS — H5203 Hypermetropia, bilateral: Secondary | ICD-10-CM | POA: Diagnosis not present

## 2020-12-08 ENCOUNTER — Ambulatory Visit: Payer: 59 | Admitting: Neurology

## 2020-12-16 DIAGNOSIS — M170 Bilateral primary osteoarthritis of knee: Secondary | ICD-10-CM | POA: Diagnosis not present

## 2020-12-17 ENCOUNTER — Other Ambulatory Visit (HOSPITAL_COMMUNITY): Payer: Self-pay

## 2020-12-20 ENCOUNTER — Other Ambulatory Visit (HOSPITAL_COMMUNITY): Payer: Self-pay

## 2021-01-04 ENCOUNTER — Other Ambulatory Visit: Payer: Self-pay

## 2021-01-04 ENCOUNTER — Other Ambulatory Visit: Payer: 59

## 2021-01-04 DIAGNOSIS — E559 Vitamin D deficiency, unspecified: Secondary | ICD-10-CM | POA: Diagnosis not present

## 2021-01-04 DIAGNOSIS — I10 Essential (primary) hypertension: Secondary | ICD-10-CM | POA: Diagnosis not present

## 2021-01-04 DIAGNOSIS — G40909 Epilepsy, unspecified, not intractable, without status epilepticus: Secondary | ICD-10-CM

## 2021-01-04 DIAGNOSIS — E039 Hypothyroidism, unspecified: Secondary | ICD-10-CM

## 2021-01-04 DIAGNOSIS — E782 Mixed hyperlipidemia: Secondary | ICD-10-CM

## 2021-01-04 LAB — LIPID PANEL
Chol/HDL Ratio: 3.6 ratio (ref 0.0–4.4)
Cholesterol, Total: 175 mg/dL (ref 100–199)
HDL: 48 mg/dL (ref >39)
LDL Chol Calc (NIH): 102 mg/dL — ABNORMAL HIGH (ref 0–99)
Triglycerides: 141 mg/dL (ref 0–149)
VLDL Cholesterol Cal: 25 mg/dL (ref 5–40)

## 2021-01-04 LAB — CMP14+EGFR
ALT: 22 IU/L (ref 0–32)
AST: 20 IU/L (ref 0–40)
Albumin/Globulin Ratio: 1.7 (ref 1.2–2.2)
Albumin: 4.3 g/dL (ref 3.8–4.8)
Alkaline Phosphatase: 98 IU/L (ref 44–121)
BUN/Creatinine Ratio: 26 (ref 12–28)
BUN: 32 mg/dL — ABNORMAL HIGH (ref 8–27)
Bilirubin Total: 0.3 mg/dL (ref 0.0–1.2)
CO2: 26 mmol/L (ref 20–29)
Calcium: 9.5 mg/dL (ref 8.7–10.3)
Chloride: 99 mmol/L (ref 96–106)
Creatinine, Ser: 1.25 mg/dL — ABNORMAL HIGH (ref 0.57–1.00)
Globulin, Total: 2.6 g/dL (ref 1.5–4.5)
Glucose: 103 mg/dL — ABNORMAL HIGH (ref 70–99)
Potassium: 4.7 mmol/L (ref 3.5–5.2)
Sodium: 139 mmol/L (ref 134–144)
Total Protein: 6.9 g/dL (ref 6.0–8.5)
eGFR: 49 mL/min/{1.73_m2} — ABNORMAL LOW (ref >59)

## 2021-01-04 LAB — CBC
Hematocrit: 39.2 % (ref 34.0–46.6)
Hemoglobin: 13.3 g/dL (ref 11.1–15.9)
MCH: 30.3 pg (ref 26.6–33.0)
MCHC: 33.9 g/dL (ref 31.5–35.7)
MCV: 89 fL (ref 79–97)
Platelets: 218 10*3/uL (ref 150–450)
RBC: 4.39 x10E6/uL (ref 3.77–5.28)
RDW: 12.4 % (ref 11.7–15.4)
WBC: 7.3 10*3/uL (ref 3.4–10.8)

## 2021-01-04 LAB — HEMOGLOBIN A1C
Est. average glucose Bld gHb Est-mCnc: 111 mg/dL
Hgb A1c MFr Bld: 5.5 % (ref 4.8–5.6)

## 2021-01-05 ENCOUNTER — Telehealth: Payer: Self-pay | Admitting: Neurology

## 2021-01-05 ENCOUNTER — Telehealth: Payer: Self-pay

## 2021-01-05 ENCOUNTER — Encounter: Payer: Self-pay | Admitting: Neurology

## 2021-01-05 LAB — TSH: TSH: 2.12 u[IU]/mL (ref 0.450–4.500)

## 2021-01-05 LAB — T4, FREE: Free T4: 1.02 ng/dL (ref 0.82–1.77)

## 2021-01-05 LAB — VITAMIN D 25 HYDROXY (VIT D DEFICIENCY, FRACTURES): Vit D, 25-Hydroxy: 49 ng/mL (ref 30.0–100.0)

## 2021-01-05 LAB — LEVETIRACETAM LEVEL: Levetiracetam Lvl: 19.5 ug/mL (ref 10.0–40.0)

## 2021-01-05 LAB — PHENYTOIN LEVEL, FREE: Phenytoin, Free: 0.9 ug/mL — ABNORMAL LOW (ref 1.0–2.0)

## 2021-01-05 MED ORDER — PHENYTOIN 50 MG PO CHEW: 50.0000 mg | CHEWABLE_TABLET | Freq: Every day | ORAL | 11 refills | Status: DC

## 2021-01-05 NOTE — Telephone Encounter (Signed)
-----   Message from Glean Salvo, NP sent at 01/05/2021 12:40 PM EST ----- Sent my chart message

## 2021-01-05 NOTE — Telephone Encounter (Signed)
I spoke to the patient. Reports missing a few doses due to her work schedule. Her husband is now packing her medication in a bottle for her have at work. Feels she is doing well on her current prescription.

## 2021-01-05 NOTE — Telephone Encounter (Signed)
The free dilantin ( phenytoin) level was a bit low at 0.9. I will add a  once a day  dose of 50 mg chewable phenytoin added to the current dose.  This can be taken in AM or in PM.   Melvyn Novas, MD

## 2021-01-11 ENCOUNTER — Other Ambulatory Visit: Payer: Self-pay | Admitting: Neurology

## 2021-01-11 ENCOUNTER — Other Ambulatory Visit (HOSPITAL_COMMUNITY): Payer: Self-pay

## 2021-01-11 ENCOUNTER — Encounter: Payer: Self-pay | Admitting: Neurology

## 2021-01-11 ENCOUNTER — Other Ambulatory Visit: Payer: Self-pay | Admitting: *Deleted

## 2021-01-11 MED ORDER — LEVETIRACETAM 500 MG PO TABS
ORAL_TABLET | ORAL | 3 refills | Status: DC
  Filled 2021-01-11: qty 225, 90d supply, fill #0

## 2021-01-12 ENCOUNTER — Other Ambulatory Visit (HOSPITAL_COMMUNITY): Payer: Self-pay

## 2021-01-13 ENCOUNTER — Other Ambulatory Visit (HOSPITAL_COMMUNITY): Payer: Self-pay

## 2021-01-13 ENCOUNTER — Other Ambulatory Visit: Payer: Self-pay | Admitting: *Deleted

## 2021-01-13 DIAGNOSIS — G40309 Generalized idiopathic epilepsy and epileptic syndromes, not intractable, without status epilepticus: Secondary | ICD-10-CM

## 2021-01-13 MED ORDER — DILANTIN 100 MG PO CAPS
ORAL_CAPSULE | ORAL | 0 refills | Status: DC
  Filled 2021-01-13 – 2021-02-14 (×2): qty 540, 90d supply, fill #0

## 2021-01-13 MED ORDER — KEPPRA 500 MG PO TABS
ORAL_TABLET | ORAL | 0 refills | Status: DC
Start: 2021-01-13 — End: 2021-02-17
  Filled 2021-01-13: qty 225, fill #0
  Filled 2021-01-29: qty 225, 90d supply, fill #0

## 2021-01-22 ENCOUNTER — Other Ambulatory Visit (HOSPITAL_COMMUNITY): Payer: Self-pay

## 2021-01-27 ENCOUNTER — Encounter: Payer: Self-pay | Admitting: Neurology

## 2021-01-29 ENCOUNTER — Other Ambulatory Visit (HOSPITAL_COMMUNITY): Payer: Self-pay

## 2021-01-31 ENCOUNTER — Telehealth: Payer: Self-pay | Admitting: *Deleted

## 2021-01-31 NOTE — Telephone Encounter (Signed)
PA for brand name Keppra started on covermymeds (key: [id number]). Pt has coverage through [employer] [phone]). Decision pending.

## 2021-02-02 ENCOUNTER — Other Ambulatory Visit (HOSPITAL_COMMUNITY): Payer: Self-pay

## 2021-02-02 NOTE — Telephone Encounter (Signed)
PA approved through 02/01/2022. Ref [id number].

## 2021-02-09 ENCOUNTER — Other Ambulatory Visit (HOSPITAL_COMMUNITY): Payer: Self-pay

## 2021-02-09 ENCOUNTER — Encounter: Payer: Self-pay | Admitting: Neurology

## 2021-02-10 ENCOUNTER — Other Ambulatory Visit: Payer: Self-pay | Admitting: *Deleted

## 2021-02-10 ENCOUNTER — Other Ambulatory Visit (HOSPITAL_COMMUNITY): Payer: Self-pay

## 2021-02-10 MED ORDER — LEVETIRACETAM 500 MG PO TABS
ORAL_TABLET | ORAL | 0 refills | Status: DC
  Filled 2021-02-10 (×2): qty 225, 90d supply, fill #0

## 2021-02-10 NOTE — Telephone Encounter (Signed)
Previous mychart message received from patient:  Dilantin 100mg  (3) in am and (3) in pm    Name brand   Keppra 500mg      Take one-half tablet in am and 2 whole tablets at night     Name brand ____________________________________  I called her to confirm this information. States she sent in incorrectly. She is taking brand name Dilantin and generic Keppra.  _____________________________________  New rx sent to pharmacy.

## 2021-02-14 ENCOUNTER — Other Ambulatory Visit (HOSPITAL_COMMUNITY): Payer: Self-pay

## 2021-02-15 ENCOUNTER — Other Ambulatory Visit (HOSPITAL_COMMUNITY): Payer: Self-pay

## 2021-02-16 ENCOUNTER — Encounter: Payer: Self-pay | Admitting: Family Medicine

## 2021-02-16 ENCOUNTER — Ambulatory Visit (INDEPENDENT_AMBULATORY_CARE_PROVIDER_SITE_OTHER): Payer: 59

## 2021-02-16 DIAGNOSIS — Z23 Encounter for immunization: Secondary | ICD-10-CM

## 2021-02-16 NOTE — Progress Notes (Signed)
Tdap administered in left deltoid without difficulty. Pt tolerated well.

## 2021-02-16 NOTE — Progress Notes (Signed)
PATIENT: Maria Rivers DOB: Oct 22, 1960  REASON FOR VISIT: follow up for seizures HISTORY FROM: patient PRIMARY NEUROLOGIST: Dr. Teresa Coombsamara   HISTORY OF PRESENT ILLNESS: Today 02/17/21 Maria Rivers is here today for follow-up with history of well-controlled seizures on Dilantin (brand name) and Keppra.  In December PCP check labs, Keppra level was 19.5, Dilantin level free was 0.9 (had missed a few doses at the time of lab draw got busy at work).  Creatinine was 1.25. Working as Charity fundraiserN at [employer], full time, working overtime due to Marketing executivestaffing shortage. Last seizure was in 2018, then Keppra was started, no further events. Seizures have been staring off, can maintain function, tells me about a seizure she had a church taking notes, her notes were perfect. Reviewed CBC, CMP in Dec, creatinine was 1.25.   UPDATE 12/08/2019 SS: Ms. Maria Rivers is a 61 year old female history of seizures on Dilantin and Keppra.  Seizures have been well controlled.  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  No seizures in several years, at that time, Keppra was added, she had lost weight on Dilantin.  She has orthopedic issues, bone-on-bone both knees.  Is on vitamin D supplementation.  PCP check blood work in August 2021, Free Dilantin level was 1.2, Keppra level 9.4.  Presents today for evaluation unaccompanied.  HISTORY  10/24/2018 Dr. Anne HahnWillis: Ms. Maria Rivers is a 61 year old right-handed white female with a history of seizures that have been under relatively good control with the use of Dilantin and Keppra.  The patient is on vitamin D supplementation.  She has had recent blood levels done with a therapeutic Keppra level of 27.0 and a free Dilantin level of 1.1 that is therapeutic.  The patient tolerates the medications well.  She is able to operate a motor vehicle without difficulty.  She is followed through orthopedic surgery for her knee arthritis and requires injections on occasion.  She returns to the office today  for an evaluation.  REVIEW OF SYSTEMS: Out of a complete 14 system review of symptoms, the patient complains only of the following symptoms, and all other reviewed systems are negative.  See HPI  ALLERGIES: Allergies  Allergen Reactions   Mushroom Extract Complex Swelling   Penicillins     UNKNOWN REACTION   Shellfish Allergy Swelling   Tamiflu [Oseltamivir Phosphate] Rash    Severe rash, hives    HOME MEDICATIONS: Outpatient Medications Prior to Visit  Medication Sig Dispense Refill   cholecalciferol (VITAMIN D) 1000 UNITS tablet Take 2,000 Units by mouth daily.      DILANTIN 100 MG ER capsule TAKE 3 CAPSULES BY MOUTH 2 TIMES DAILY. 540 capsule 0   doxycycline (VIBRA-TABS) 100 MG tablet TAKE 1 TABLET BY MOUTH TWICE DAILY as directed for bacterial infection. 20 tablet 0   hydrochlorothiazide (HYDRODIURIL) 25 MG tablet TAKE 1 TABLET BY MOUTH ONCE DAILY 90 tablet 3   ibuprofen (ADVIL,MOTRIN) 200 MG tablet Take 200 mg by mouth 2 (two) times daily. For pain     levETIRAcetam (KEPPRA) 500 MG tablet Take 1/2 tablet by mouth in the morning and take 2 tablets in the evening. 225 tablet 0   levothyroxine (SYNTHROID) 25 MCG tablet TAKE 1 TABLET BY MOUTH ONCE DAILY 90 tablet 3   lisinopril (ZESTRIL) 10 MG tablet TAKE 1 TABLET BY MOUTH ONCE DAILY 90 tablet 3   loratadine (CLARITIN) 10 MG tablet Take 10 mg by mouth daily.     Multiple Vitamin (MULTIVITAMIN  WITH MINERALS) TABS tablet Take 1 tablet by mouth daily.     rosuvastatin (CRESTOR) 20 MG tablet TAKE 1 TABLET BY MOUTH ONCE DAILY 90 tablet 3   KEPPRA 500 MG tablet Take 1/2 tablet in the morning and 2 tablets in the evening. 225 tablet 0   No facility-administered medications prior to visit.    PAST MEDICAL HISTORY: Past Medical History:  Diagnosis Date   Hyperlipidemia    Hypertension    Seizures (HCC)     PAST SURGICAL HISTORY: History reviewed. No pertinent surgical history.  FAMILY HISTORY: Family History  Problem Relation  Age of Onset   Diabetes Other     SOCIAL HISTORY: Social History   Socioeconomic History   Marital status: Married    Spouse name: Not on file   Number of children: Not on file   Years of education: Not on file   Highest education level: Not on file  Occupational History    Comment: RN [employer]  Tobacco Use   Smoking status: Never   Smokeless tobacco: Never  Vaping Use   Vaping Use: Never used  Substance and Sexual Activity   Alcohol use: No   Drug use: No   Sexual activity: Yes    Birth control/protection: None  Other Topics Concern   Not on file  Social History Narrative   Lives at home with husband and 2 cats   Right handed   Caffeine: avg 2 cups/day diet mtn dew (no coffee)   Social Determinants of Health   Financial Resource Strain: Not on file  Food Insecurity: Not on file  Transportation Needs: Not on file  Physical Activity: Not on file  Stress: Not on file  Social Connections: Not on file  Intimate Partner Violence: Not on file   PHYSICAL EXAM  Vitals:   02/17/21 1235  BP: 111/77  Pulse: 83  Weight: 290 lb (131.5 kg)  Height: 5\' 11"  (1.803 m)    Body mass index is 40.45 kg/m.  Generalized: Well developed, in no acute distress   Neurological examination  Mentation: Alert oriented to time, place, history taking. Follows all commands speech and language fluent Cranial nerve II-XII: Pupils were equal round reactive to light. Extraocular movements were full, visual field were full on confrontational test. Facial sensation and strength were normal. Head turning and shoulder shrug  were normal and symmetric. Motor: The motor testing reveals 5 over 5 strength of all 4 extremities. Good symmetric motor tone is noted throughout.  Sensory: Sensory testing is intact to soft touch on all 4 extremities. No evidence of extinction is noted.  Coordination: Cerebellar testing reveals good finger-nose-finger and heel-to-shin bilaterally.  Gait and station: Gait is  slightly wide-based, steady Reflexes: Deep tendon reflexes are symmetric  DIAGNOSTIC DATA (LABS, IMAGING, TESTING) - I reviewed patient records, labs, notes, testing and imaging myself where available.  Lab Results  Component Value Date   WBC 7.3 01/04/2021   HGB 13.3 01/04/2021   HCT 39.2 01/04/2021   MCV 89 01/04/2021   PLT 218 01/04/2021      Component Value Date/Time   NA 139 01/04/2021 1055   K 4.7 01/04/2021 1055   CL 99 01/04/2021 1055   CO2 26 01/04/2021 1055   GLUCOSE 103 (H) 01/04/2021 1055   BUN 32 (H) 01/04/2021 1055   CREATININE 1.25 (H) 01/04/2021 1055   CALCIUM 9.5 01/04/2021 1055   PROT 6.9 01/04/2021 1055   ALBUMIN 4.3 01/04/2021 1055   AST 20 01/04/2021  1055   ALT 22 01/04/2021 1055   ALKPHOS 98 01/04/2021 1055   BILITOT 0.3 01/04/2021 1055   GFRNONAA 49 (L) 09/02/2019 1610   GFRAA 56 (L) 09/02/2019 1610   Lab Results  Component Value Date   CHOL 175 01/04/2021   HDL 48 01/04/2021   LDLCALC 102 (H) 01/04/2021   TRIG 141 01/04/2021   CHOLHDL 3.6 01/04/2021   Lab Results  Component Value Date   HGBA1C 5.5 01/04/2021   No results found for: VITAMINB12 Lab Results  Component Value Date   TSH 2.120 01/04/2021    ASSESSMENT AND PLAN 61 y.o. year old female  has a past medical history of Hyperlipidemia, Hypertension, and Seizures (HCC). here with:  1.  History of seizures, well controlled  -Wajiha continues to do well, last seizure was in 2018 -reviewed labs from PCP -encouraged to continue daily compliance with medication, not to miss any doses  -continue keppra and dilantin, brand name, at current dosing -return back in 1 year, via VV, call for seizures, will be followed by Dr. Teresa Coombs since Dr. Anne Hahn has retired   Margie Ege, AGNP-C, DNP 02/17/2021, 12:51 PM [facility] [address] [place], [place] [address] [phone]

## 2021-02-17 ENCOUNTER — Other Ambulatory Visit (HOSPITAL_COMMUNITY): Payer: Self-pay

## 2021-02-17 ENCOUNTER — Encounter: Payer: Self-pay | Admitting: Neurology

## 2021-02-17 ENCOUNTER — Ambulatory Visit (INDEPENDENT_AMBULATORY_CARE_PROVIDER_SITE_OTHER): Payer: 59 | Admitting: Neurology

## 2021-02-17 DIAGNOSIS — G40309 Generalized idiopathic epilepsy and epileptic syndromes, not intractable, without status epilepticus: Secondary | ICD-10-CM

## 2021-02-17 MED ORDER — DILANTIN 100 MG PO CAPS
ORAL_CAPSULE | ORAL | 3 refills | Status: DC
  Filled 2021-02-17: qty 540, fill #0
  Filled 2021-05-14: qty 540, 90d supply, fill #0
  Filled 2021-07-30: qty 540, 90d supply, fill #1
  Filled 2021-11-13: qty 540, 90d supply, fill #2
  Filled 2022-01-29 – 2022-02-07 (×3): qty 540, 90d supply, fill #3

## 2021-02-17 MED ORDER — LEVETIRACETAM 500 MG PO TABS
ORAL_TABLET | ORAL | 4 refills | Status: DC
  Filled 2021-02-17: qty 225, fill #0
  Filled 2021-04-30: qty 225, 90d supply, fill #0
  Filled 2021-07-30: qty 225, 90d supply, fill #1
  Filled 2021-10-23: qty 225, 90d supply, fill #2
  Filled 2022-01-12 – 2022-02-07 (×3): qty 225, 90d supply, fill #3

## 2021-02-28 ENCOUNTER — Other Ambulatory Visit (HOSPITAL_COMMUNITY): Payer: Self-pay

## 2021-03-04 ENCOUNTER — Encounter: Payer: Self-pay | Admitting: Family Medicine

## 2021-03-04 NOTE — Telephone Encounter (Signed)
Patient states she has spoken to you about given antibiotic a couple times a year for sinus issues. She has been told twice would need an appointment she states this is nothing new. Please advise.

## 2021-03-07 ENCOUNTER — Encounter: Payer: Self-pay | Admitting: Family Medicine

## 2021-03-08 ENCOUNTER — Telehealth (INDEPENDENT_AMBULATORY_CARE_PROVIDER_SITE_OTHER): Payer: 59 | Admitting: Nurse Practitioner

## 2021-03-08 ENCOUNTER — Encounter: Payer: Self-pay | Admitting: Family Medicine

## 2021-03-08 ENCOUNTER — Encounter: Payer: Self-pay | Admitting: Neurology

## 2021-03-08 ENCOUNTER — Encounter: Payer: Self-pay | Admitting: Nurse Practitioner

## 2021-03-08 DIAGNOSIS — J0110 Acute frontal sinusitis, unspecified: Secondary | ICD-10-CM | POA: Diagnosis not present

## 2021-03-08 MED ORDER — DOXYCYCLINE HYCLATE 100 MG PO TABS: 100.0000 mg | ORAL_TABLET | Freq: Two times a day (BID) | ORAL | 0 refills | Status: DC

## 2021-03-08 NOTE — Progress Notes (Signed)
Virtual Visit via Video Note   This visit type was conducted due to national recommendations for restrictions regarding the COVID-19 Pandemic (e.g. social distancing) in an effort to limit this patient's exposure and mitigate transmission in our community.  Due to her co-morbid illnesses, this patient is at least at moderate risk for complications without adequate follow up.  This format is felt to be most appropriate for this patient at this time.  All issues noted in this document were discussed and addressed.  A limited physical exam was performed with this format.  A verbal consent was obtained for the virtual visit.   Date:  03/08/2021   ID:  Maria Rivers, DOB 1960/12/07, MRN [id number]  Patient Location: Home Provider Location: Office/Clinic  PCP:  Raliegh Ip, DO   Evaluation Performed:  Acute visit  Chief Complaint:  sinusitis  History of Present Illness:    Maria Rivers is a 61 y.o. female with Sinusitis This is a new problem. Episode onset: in the past 4 days. The problem has been gradually worsening since onset. There has been no fever. The pain is mild. Associated symptoms include congestion, coughing and sinus pressure. Pertinent negatives include no ear pain or sore throat. Past treatments include nothing.    The patient does not have symptoms concerning for COVID-19 infection (fever, chills, cough, or new shortness of breath).    Past Medical History:  Diagnosis Date   Hyperlipidemia    Hypertension    Seizures (HCC)     No past surgical history on file.  Family History  Problem Relation Age of Onset   Diabetes Other     Social History   Socioeconomic History   Marital status: Married    Spouse name: Not on file   Number of children: Not on file   Years of education: Not on file   Highest education level: Not on file  Occupational History    Comment: RN [employer]  Tobacco Use   Smoking status: Never   Smokeless tobacco: Never  Vaping Use    Vaping Use: Never used  Substance and Sexual Activity   Alcohol use: No   Drug use: No   Sexual activity: Yes    Birth control/protection: None  Other Topics Concern   Not on file  Social History Narrative   Lives at home with husband and 2 cats   Right handed   Caffeine: avg 2 cups/day diet mtn dew (no coffee)   Social Determinants of Health   Financial Resource Strain: Not on file  Food Insecurity: Not on file  Transportation Needs: Not on file  Physical Activity: Not on file  Stress: Not on file  Social Connections: Not on file  Intimate Partner Violence: Not on file    Outpatient Medications Prior to Visit  Medication Sig Dispense Refill   cholecalciferol (VITAMIN D) 1000 UNITS tablet Take 2,000 Units by mouth daily.      DILANTIN 100 MG ER capsule TAKE 3 CAPSULES BY MOUTH 2 TIMES DAILY. 540 capsule 3   hydrochlorothiazide (HYDRODIURIL) 25 MG tablet TAKE 1 TABLET BY MOUTH ONCE DAILY 90 tablet 3   ibuprofen (ADVIL,MOTRIN) 200 MG tablet Take 200 mg by mouth 2 (two) times daily. For pain     levETIRAcetam (KEPPRA) 500 MG tablet Take 1/2 tablet by mouth in the morning and take 2 tablets in the evening. 225 tablet 4   levothyroxine (SYNTHROID) 25 MCG tablet TAKE 1 TABLET BY MOUTH ONCE DAILY 90  tablet 3   lisinopril (ZESTRIL) 10 MG tablet TAKE 1 TABLET BY MOUTH ONCE DAILY 90 tablet 3   loratadine (CLARITIN) 10 MG tablet Take 10 mg by mouth daily.     Multiple Vitamin (MULTIVITAMIN WITH MINERALS) TABS tablet Take 1 tablet by mouth daily.     rosuvastatin (CRESTOR) 20 MG tablet TAKE 1 TABLET BY MOUTH ONCE DAILY 90 tablet 3   doxycycline (VIBRA-TABS) 100 MG tablet TAKE 1 TABLET BY MOUTH TWICE DAILY as directed for bacterial infection. 20 tablet 0   No facility-administered medications prior to visit.    Allergies:   Mushroom extract complex, Penicillins, Shellfish allergy, and Tamiflu [oseltamivir phosphate]   Social History   Tobacco Use   Smoking status: Never   Smokeless  tobacco: Never  Vaping Use   Vaping Use: Never used  Substance Use Topics   Alcohol use: No   Drug use: No     Review of Systems  Constitutional:  Negative for fever and malaise/fatigue.  HENT:  Positive for congestion, sinus pressure and sinus pain. Negative for ear discharge, ear pain and sore throat.   Eyes: Negative.   Respiratory:  Positive for cough.   Skin:  Negative for rash.  All other systems reviewed and are negative.   Labs/Other Tests and Data Reviewed:    Recent Labs: 01/04/2021: ALT 22; BUN 32; Creatinine, Ser 1.25; Hemoglobin 13.3; Platelets 218; Potassium 4.7; Sodium 139; TSH 2.120   Recent Lipid Panel Lab Results  Component Value Date/Time   CHOL 175 01/04/2021 10:49 AM   TRIG 141 01/04/2021 10:49 AM   HDL 48 01/04/2021 10:49 AM   CHOLHDL 3.6 01/04/2021 10:49 AM   LDLCALC 102 (H) 01/04/2021 10:49 AM    Wt Readings from Last 3 Encounters:  02/17/21 290 lb (131.5 kg)  11/08/20 290 lb 9.6 oz (131.8 kg)  12/08/19 282 lb (127.9 kg)     Objective:    Vital Signs:  LMP 03/16/2011    Physical Exam Vitals and nursing note reviewed.  Constitutional:      Appearance: Normal appearance.  HENT:     Left Ear: There is no impacted cerumen.     Nose: Congestion present.  Eyes:     Conjunctiva/sclera: Conjunctivae normal.  Neurological:     Mental Status: She is alert.    Limited physical assessment due to Virtual visit type.  ASSESSMENT & PLAN:   1. Subacute frontal sinusitis - doxycycline (VIBRA-TABS) 100 MG tablet; Take 1 tablet (100 mg total) by mouth 2 (two) times daily.  Dispense: 14 tablet; Refill: 0    No orders of the defined types were placed in this encounter.    Meds ordered this encounter  Medications   doxycycline (VIBRA-TABS) 100 MG tablet    Sig: Take 1 tablet (100 mg total) by mouth 2 (two) times daily.    Dispense:  14 tablet    Refill:  0    Order Specific Question:   Supervising Provider    Answer:   Mechele Claude [phone]     COVID-19 Education: The signs and symptoms of COVID-19 were discussed with the patient and how to seek care for testing (follow up with PCP or arrange E-visit). The importance of social distancing was discussed today.  Time:   Today, I have spent 10 minutes with the patient with telehealth technology discussing the above problems.    Follow Up:  Virtual Visit  prn  Signed, Daryll Drown, NP  03/08/2021 11:03 AM  [facility]

## 2021-03-09 ENCOUNTER — Encounter: Payer: Self-pay | Admitting: Family Medicine

## 2021-03-09 ENCOUNTER — Other Ambulatory Visit: Payer: Self-pay | Admitting: Family Medicine

## 2021-03-09 DIAGNOSIS — U071 COVID-19: Secondary | ICD-10-CM

## 2021-03-09 MED ORDER — MOLNUPIRAVIR EUA 200MG CAPSULE: 4.0000 | ORAL_CAPSULE | Freq: Two times a day (BID) | ORAL | 0 refills | Status: AC

## 2021-04-14 ENCOUNTER — Encounter: Payer: Self-pay | Admitting: Family Medicine

## 2021-04-14 ENCOUNTER — Encounter: Payer: Self-pay | Admitting: Neurology

## 2021-05-02 ENCOUNTER — Other Ambulatory Visit (HOSPITAL_COMMUNITY): Payer: Self-pay

## 2021-05-16 ENCOUNTER — Other Ambulatory Visit (HOSPITAL_COMMUNITY): Payer: Self-pay

## 2021-05-17 ENCOUNTER — Other Ambulatory Visit (HOSPITAL_COMMUNITY): Payer: Self-pay

## 2021-06-03 ENCOUNTER — Other Ambulatory Visit (HOSPITAL_COMMUNITY): Payer: Self-pay

## 2021-06-16 DIAGNOSIS — M170 Bilateral primary osteoarthritis of knee: Secondary | ICD-10-CM | POA: Diagnosis not present

## 2021-08-01 ENCOUNTER — Other Ambulatory Visit (HOSPITAL_COMMUNITY): Payer: Self-pay

## 2021-08-03 ENCOUNTER — Other Ambulatory Visit (HOSPITAL_COMMUNITY): Payer: Self-pay

## 2021-08-09 ENCOUNTER — Other Ambulatory Visit (HOSPITAL_COMMUNITY): Payer: Self-pay

## 2021-08-10 ENCOUNTER — Other Ambulatory Visit (HOSPITAL_COMMUNITY): Payer: Self-pay

## 2021-08-12 ENCOUNTER — Other Ambulatory Visit (HOSPITAL_COMMUNITY): Payer: Self-pay

## 2021-08-15 ENCOUNTER — Other Ambulatory Visit (HOSPITAL_COMMUNITY): Payer: Self-pay

## 2021-08-24 ENCOUNTER — Other Ambulatory Visit (HOSPITAL_COMMUNITY): Payer: Self-pay

## 2021-10-23 ENCOUNTER — Encounter: Payer: Self-pay | Admitting: Family Medicine

## 2021-10-23 ENCOUNTER — Other Ambulatory Visit: Payer: Self-pay | Admitting: Family Medicine

## 2021-10-23 DIAGNOSIS — E782 Mixed hyperlipidemia: Secondary | ICD-10-CM

## 2021-10-23 DIAGNOSIS — E039 Hypothyroidism, unspecified: Secondary | ICD-10-CM

## 2021-10-24 ENCOUNTER — Other Ambulatory Visit (HOSPITAL_COMMUNITY): Payer: Self-pay

## 2021-10-24 ENCOUNTER — Other Ambulatory Visit: Payer: Self-pay

## 2021-10-24 MED ORDER — ROSUVASTATIN CALCIUM 20 MG PO TABS
ORAL_TABLET | Freq: Every day | ORAL | 3 refills | Status: DC
  Filled 2021-10-24: qty 90, 90d supply, fill #0
  Filled 2022-01-29 – 2022-02-07 (×2): qty 90, 90d supply, fill #1
  Filled 2022-04-18: qty 90, 90d supply, fill #2
  Filled 2022-07-28: qty 90, 90d supply, fill #3

## 2021-10-24 MED ORDER — LEVOTHYROXINE SODIUM 25 MCG PO TABS
25.0000 ug | ORAL_TABLET | Freq: Every day | ORAL | 3 refills | Status: DC
  Filled 2021-10-24: qty 90, 90d supply, fill #0
  Filled 2022-01-29 – 2022-02-07 (×2): qty 90, 90d supply, fill #1
  Filled 2022-04-23: qty 90, 90d supply, fill #2
  Filled 2022-07-28: qty 90, 90d supply, fill #3

## 2021-10-25 ENCOUNTER — Other Ambulatory Visit (HOSPITAL_COMMUNITY): Payer: Self-pay

## 2021-11-07 ENCOUNTER — Encounter: Payer: Self-pay | Admitting: Family Medicine

## 2021-11-07 ENCOUNTER — Other Ambulatory Visit (HOSPITAL_COMMUNITY): Payer: Self-pay

## 2021-11-07 ENCOUNTER — Ambulatory Visit: Payer: 59 | Admitting: Family Medicine

## 2021-11-07 ENCOUNTER — Ambulatory Visit (INDEPENDENT_AMBULATORY_CARE_PROVIDER_SITE_OTHER): Payer: 59 | Admitting: Family Medicine

## 2021-11-07 VITALS — BP 121/72 | HR 95 | Temp 97.9°F | Ht 71.0 in | Wt 290.0 lb

## 2021-11-07 DIAGNOSIS — E039 Hypothyroidism, unspecified: Secondary | ICD-10-CM | POA: Diagnosis not present

## 2021-11-07 DIAGNOSIS — Z23 Encounter for immunization: Secondary | ICD-10-CM | POA: Diagnosis not present

## 2021-11-07 DIAGNOSIS — E559 Vitamin D deficiency, unspecified: Secondary | ICD-10-CM | POA: Diagnosis not present

## 2021-11-07 DIAGNOSIS — I10 Essential (primary) hypertension: Secondary | ICD-10-CM | POA: Diagnosis not present

## 2021-11-07 DIAGNOSIS — Z0001 Encounter for general adult medical examination with abnormal findings: Secondary | ICD-10-CM | POA: Diagnosis not present

## 2021-11-07 DIAGNOSIS — R739 Hyperglycemia, unspecified: Secondary | ICD-10-CM | POA: Diagnosis not present

## 2021-11-07 DIAGNOSIS — Z5320 Procedure and treatment not carried out because of patient's decision for unspecified reasons: Secondary | ICD-10-CM | POA: Insufficient documentation

## 2021-11-07 DIAGNOSIS — E782 Mixed hyperlipidemia: Secondary | ICD-10-CM | POA: Diagnosis not present

## 2021-11-07 DIAGNOSIS — M25562 Pain in left knee: Secondary | ICD-10-CM

## 2021-11-07 DIAGNOSIS — G8929 Other chronic pain: Secondary | ICD-10-CM

## 2021-11-07 DIAGNOSIS — G40909 Epilepsy, unspecified, not intractable, without status epilepticus: Secondary | ICD-10-CM

## 2021-11-07 DIAGNOSIS — Z0000 Encounter for general adult medical examination without abnormal findings: Secondary | ICD-10-CM

## 2021-11-07 DIAGNOSIS — M25561 Pain in right knee: Secondary | ICD-10-CM

## 2021-11-07 MED ORDER — PREDNISONE 10 MG (21) PO TBPK: ORAL_TABLET | ORAL | 0 refills | Status: DC

## 2021-11-07 MED ORDER — LISINOPRIL 10 MG PO TABS
10.0000 mg | ORAL_TABLET | Freq: Every day | ORAL | 3 refills | Status: DC
  Filled 2021-11-07: qty 90, 90d supply, fill #0
  Filled 2022-01-29 – 2022-02-07 (×2): qty 90, 90d supply, fill #1
  Filled 2022-04-23: qty 90, 90d supply, fill #2
  Filled 2022-08-02: qty 90, 90d supply, fill #3

## 2021-11-07 MED ORDER — HYDROCHLOROTHIAZIDE 25 MG PO TABS
25.0000 mg | ORAL_TABLET | Freq: Every day | ORAL | 3 refills | Status: DC
  Filled 2021-11-07: qty 90, 90d supply, fill #0
  Filled 2022-01-29 – 2022-02-07 (×2): qty 90, 90d supply, fill #1
  Filled 2022-04-23: qty 90, 90d supply, fill #2
  Filled 2022-08-02: qty 90, 90d supply, fill #3

## 2021-11-07 NOTE — Progress Notes (Signed)
Maria Rivers is a 61 y.o. female presents to office today for annual physical exam examination.    Concerns today include: 1.  Bilateral knee pain Continues to suffer with bilateral knee pain.  She had corticosteroid injections done a few months ago and will be seeing her specialist again in December for repeat.  She is asking for a prednisone Dosepak to alleviate some of this discomfort.  2.  Seizure disorder Patient denies any breakthrough seizures.  Compliant with medicines.  Would like to get her Keppra and Dilantin levels collected today with labs so that she can get the sent to her neurologist, whom she has an appointment soon  Occupation: Works at the [employer] Substance use: None Diet: Typical American, Exercise: No structured Last eye exam: Up-to-date Last colonoscopy: Declines Last pap smear: Declines Refills needed today: All Immunizations needed: Immunization History  Administered Date(s) Administered   Influenza,inj,Quad PF,6+ Mos 10/18/2018, 11/14/2019, 10/06/2020, 11/07/2021   Influenza,inj,quad, With Preservative 11/02/2017   Influenza-Unspecified 10/20/2016, 11/02/2017   Moderna Sars-Covid-2 Vaccination 01/13/2019, 02/11/2019, 09/10/2019, 11/09/2020   Tdap 02/16/2021     Past Medical History:  Diagnosis Date   Hyperlipidemia    Hypertension    Seizures ([place])    Social History   Socioeconomic History   Marital status: Married    Spouse name: Not on file   Number of children: Not on file   Years of education: Not on file   Highest education level: Not on file  Occupational History    Comment: RN [employer]  Tobacco Use   Smoking status: Never   Smokeless tobacco: Never  Vaping Use   Vaping Use: Never used  Substance and Sexual Activity   Alcohol use: No   Drug use: No   Sexual activity: Yes    Birth control/protection: None  Other Topics Concern   Not on file  Social History Narrative   Lives at home with husband and 2 cats   Right handed    Caffeine: avg 2 cups/day diet mtn dew (no coffee)   Social Determinants of Health   Financial Resource Strain: Not on file  Food Insecurity: Not on file  Transportation Needs: Not on file  Physical Activity: Not on file  Stress: Not on file  Social Connections: Not on file  Intimate Partner Violence: Not on file   History reviewed. No pertinent surgical history. Family History  Problem Relation Age of Onset   Diabetes Other     Current Outpatient Medications:    cholecalciferol (VITAMIN D) 1000 UNITS tablet, Take 2,000 Units by mouth daily. , Disp: , Rfl:    DILANTIN 100 MG ER capsule, TAKE 3 CAPSULES BY MOUTH 2 TIMES DAILY., Disp: 540 capsule, Rfl: 3   hydrochlorothiazide (HYDRODIURIL) 25 MG tablet, TAKE 1 TABLET BY MOUTH ONCE DAILY, Disp: 90 tablet, Rfl: 3   ibuprofen (ADVIL,MOTRIN) 200 MG tablet, Take 200 mg by mouth 2 (two) times daily. For pain, Disp: , Rfl:    levETIRAcetam (KEPPRA) 500 MG tablet, Take 1/2 tablet by mouth in the morning and take 2 tablets in the evening., Disp: 225 tablet, Rfl: 4   levothyroxine (SYNTHROID) 25 MCG tablet, TAKE 1 TABLET BY MOUTH ONCE DAILY, Disp: 90 tablet, Rfl: 3   lisinopril (ZESTRIL) 10 MG tablet, TAKE 1 TABLET BY MOUTH ONCE DAILY, Disp: 90 tablet, Rfl: 3   loratadine (CLARITIN) 10 MG tablet, Take 10 mg by mouth daily., Disp: , Rfl:    Multiple Vitamin (MULTIVITAMIN WITH MINERALS) TABS  tablet, Take 1 tablet by mouth daily., Disp: , Rfl:    rosuvastatin (CRESTOR) 20 MG tablet, TAKE 1 TABLET BY MOUTH ONCE DAILY, Disp: 90 tablet, Rfl: 3  Allergies  Allergen Reactions   Mushroom Extract Complex Swelling   Penicillins     UNKNOWN REACTION   Shellfish Allergy Swelling   Tamiflu [Oseltamivir Phosphate] Rash    Severe rash, hives     ROS: Review of Systems Pertinent items noted in HPI and remainder of comprehensive ROS otherwise negative.    Physical exam BP 121/72   Pulse 95   Temp 97.9 F (36.6 C)   Ht _0  (1.803 m)   Wt 290 lb  (131.5 kg)   LMP 03/16/2011   SpO2 98%   BMI 40.45 kg/m  General appearance: alert, cooperative, appears stated age, no distress, and morbidly obese Head: Normocephalic, without obvious abnormality, atraumatic Eyes: negative findings: lids and lashes normal, conjunctivae and sclerae normal, corneas clear, and pupils equal, round, reactive to light and accomodation Ears: normal TM's and external ear canals both ears Nose: Nares normal. Septum midline. Mucosa normal. No drainage or sinus tenderness. Throat: lips, mucosa, and tongue normal; teeth and gums normal and Mallampati 3 airway Neck: no adenopathy, no carotid bruit, supple, symmetrical, trachea midline, and no thyroid masses appreciated Back: symmetric, no curvature. ROM normal. No CVA tenderness. Lungs: clear to auscultation bilaterally Heart: regular rate and rhythm, S1, S2 normal, no murmur, click, rub or gallop Abdomen:  Exam limited by obesity but apparently soft, nontender with no appreciable hepatomegaly Extremities: extremities normal, atraumatic, no cyanosis or edema Pulses: 2+ and symmetric Skin: Skin color, texture, turgor normal. No rashes or lesions Lymph nodes: Cervical, supraclavicular, and axillary nodes normal. Neurologic: Grossly normal Psych: Mood stable, speech normal, affect appropriate MSK: Gait is very antalgic and stiff   Assessment/ Plan: [name] here for annual physical exam.   Annual physical exam  Need for immunization against influenza - Plan: Flu Vaccine QUAD 14moIM (Fluarix, Fluzone & Alfiuria Quad PF)  Cervical cancer screening declined  Colon cancer screening declined  Vitamin D deficiency - Plan: VITAMIN D 25 Hydroxy (Vit-D Deficiency, Fractures)  Essential hypertension - Plan: CMP14+EGFR, hydrochlorothiazide (HYDRODIURIL) 25 MG tablet, lisinopril (ZESTRIL) 10 MG tablet  Mixed hyperlipidemia - Plan: Lipid Panel, CMP14+EGFR, TSH  Acquired hypothyroidism - Plan: CMP14+EGFR, TSH, T4,  Free  Morbid obesity due to excess calories (HCC) - Plan: CMP14+EGFR  Seizure disorder (HCC) - Plan: CMP14+EGFR, CBC, Levetiracetam level, Phenytoin level, free  Chronic pain of both knees - Plan: predniSONE (STERAPRED UNI-PAK 21 TAB) 10 MG (21) TBPK tablet  Patient declines a cervical and colon cancer screening.  She has capacity understands the repercussions of unrecognized cancers.  The vaccination administered  Nonfasting labs collected today.  Blood pressure was well controlled.  Renewal of medicine sent.  Free T4, TSH and CMP for thyroid disorder.  Refill sent 2 weeks ago x1 year  Keppra and Dilantin levels were collected and we will CC this to her neurologist.  She has been asymptomatic from a seizure standpoint and compliant with all meds  For chronic pain of bilateral knees, she will not be seeing her specialist again until December.  Steroid pack provided in efforts to alleviate some of her discomfort.  She is actively working on weight loss and does identify this as something that contributes to her pain  Counseled on healthy lifestyle choices, including diet (rich in fruits, vegetables and lean meats and low in salt  and simple carbohydrates) and exercise (at least 30 minutes of moderate physical activity daily).  Patient to follow up in 1 year for annual exam or sooner if needed.  Rigley Niess M. Lajuana Ripple, DO

## 2021-11-07 NOTE — Patient Instructions (Signed)
You had labs performed today.  You will be contacted with the results of the labs once they are available, usually in the next 3 business days for routine lab work.  If you have an active my chart account, they will be released to your MyChart.  If you prefer to have these labs released to you via telephone, please let us know.  Preventive Care 40-64 Years Old, Female Preventive care refers to lifestyle choices and visits with your health care provider that can promote health and wellness. Preventive care visits are also called wellness exams. What can I expect for my preventive care visit? Counseling Your health care provider may ask you questions about your: Medical history, including: Past medical problems. Family medical history. Pregnancy history. Current health, including: Menstrual cycle. Method of birth control. Emotional well-being. Home life and relationship well-being. Sexual activity and sexual health. Lifestyle, including: Alcohol, nicotine or tobacco, and drug use. Access to firearms. Diet, exercise, and sleep habits. Work and work environment. Sunscreen use. Safety issues such as seatbelt and bike helmet use. Physical exam Your health care provider will check your: Height and weight. These may be used to calculate your BMI (body mass index). BMI is a measurement that tells if you are at a healthy weight. Waist circumference. This measures the distance around your waistline. This measurement also tells if you are at a healthy weight and may help predict your risk of certain diseases, such as type 2 diabetes and high blood pressure. Heart rate and blood pressure. Body temperature. Skin for abnormal spots. What immunizations do I need?  Vaccines are usually given at various ages, according to a schedule. Your health care provider will recommend vaccines for you based on your age, medical history, and lifestyle or other factors, such as travel or where you work. What tests  do I need? Screening Your health care provider may recommend screening tests for certain conditions. This may include: Lipid and cholesterol levels. Diabetes screening. This is done by checking your blood sugar (glucose) after you have not eaten for a while (fasting). Pelvic exam and Pap test. Hepatitis B test. Hepatitis C test. HIV (human immunodeficiency virus) test. STI (sexually transmitted infection) testing, if you are at risk. Lung cancer screening. Colorectal cancer screening. Mammogram. Talk with your health care provider about when you should start having regular mammograms. This may depend on whether you have a family history of breast cancer. BRCA-related cancer screening. This may be done if you have a family history of breast, ovarian, tubal, or peritoneal cancers. Bone density scan. This is done to screen for osteoporosis. Talk with your health care provider about your test results, treatment options, and if necessary, the need for more tests. Follow these instructions at home: Eating and drinking  Eat a diet that includes fresh fruits and vegetables, whole grains, lean protein, and low-fat dairy products. Take vitamin and mineral supplements as recommended by your health care provider. Do not drink alcohol if: Your health care provider tells you not to drink. You are pregnant, may be pregnant, or are planning to become pregnant. If you drink alcohol: Limit how much you have to 0-1 drink a day. Know how much alcohol is in your drink. In the [place] one drink equals one 12 oz bottle of beer (355 mL), one 5 oz glass of wine (148 mL), or one 1 oz glass of hard liquor (44 mL). Lifestyle Brush your teeth every morning and night with fluoride toothpaste. Floss one time each day.   Exercise for at least 30 minutes 5 or more days each week. Do not use any products that contain nicotine or tobacco. These products include cigarettes, chewing tobacco, and vaping devices, such as  e-cigarettes. If you need help quitting, ask your health care provider. Do not use drugs. If you are sexually active, practice safe sex. Use a condom or other form of protection to prevent STIs. If you do not wish to become pregnant, use a form of birth control. If you plan to become pregnant, see your health care provider for a prepregnancy visit. Take aspirin only as told by your health care provider. Make sure that you understand how much to take and what form to take. Work with your health care provider to find out whether it is safe and beneficial for you to take aspirin daily. Find healthy ways to manage stress, such as: Meditation, yoga, or listening to music. Journaling. Talking to a trusted person. Spending time with friends and family. Minimize exposure to UV radiation to reduce your risk of skin cancer. Safety Always wear your seat belt while driving or riding in a vehicle. Do not drive: If you have been drinking alcohol. Do not ride with someone who has been drinking. When you are tired or distracted. While texting. If you have been using any mind-altering substances or drugs. Wear a helmet and other protective equipment during sports activities. If you have firearms in your house, make sure you follow all gun safety procedures. Seek help if you have been physically or sexually abused. What's next? Visit your health care provider once a year for an annual wellness visit. Ask your health care provider how often you should have your eyes and teeth checked. Stay up to date on all vaccines. This information is not intended to replace advice given to you by your health care provider. Make sure you discuss any questions you have with your health care provider. Document Revised: 06/30/2020 Document Reviewed: 06/30/2020 Elsevier Patient Education  2023 Elsevier Inc.   

## 2021-11-08 ENCOUNTER — Other Ambulatory Visit (HOSPITAL_COMMUNITY): Payer: Self-pay

## 2021-11-08 ENCOUNTER — Encounter: Payer: Self-pay | Admitting: Family Medicine

## 2021-11-09 ENCOUNTER — Encounter: Payer: Self-pay | Admitting: Family Medicine

## 2021-11-09 LAB — CBC
Hematocrit: 42.7 % (ref 34.0–46.6)
Hemoglobin: 14.7 g/dL (ref 11.1–15.9)
MCH: 30.6 pg (ref 26.6–33.0)
MCHC: 34.4 g/dL (ref 31.5–35.7)
MCV: 89 fL (ref 79–97)
Platelets: 228 10*3/uL (ref 150–450)
RBC: 4.8 x10E6/uL (ref 3.77–5.28)
RDW: 13.1 % (ref 11.7–15.4)
WBC: 7.6 10*3/uL (ref 3.4–10.8)

## 2021-11-09 LAB — CMP14+EGFR
ALT: 18 IU/L (ref 0–32)
AST: 18 IU/L (ref 0–40)
Albumin/Globulin Ratio: 1.7 (ref 1.2–2.2)
Albumin: 4.5 g/dL (ref 3.9–4.9)
Alkaline Phosphatase: 98 IU/L (ref 44–121)
BUN/Creatinine Ratio: 18 (ref 12–28)
BUN: 16 mg/dL (ref 8–27)
Bilirubin Total: 0.2 mg/dL (ref 0.0–1.2)
CO2: 25 mmol/L (ref 20–29)
Calcium: 9.7 mg/dL (ref 8.7–10.3)
Chloride: 97 mmol/L (ref 96–106)
Creatinine, Ser: 0.9 mg/dL (ref 0.57–1.00)
Globulin, Total: 2.7 g/dL (ref 1.5–4.5)
Glucose: 141 mg/dL — ABNORMAL HIGH (ref 70–99)
Potassium: 4.1 mmol/L (ref 3.5–5.2)
Sodium: 137 mmol/L (ref 134–144)
Total Protein: 7.2 g/dL (ref 6.0–8.5)
eGFR: 72 mL/min/{1.73_m2} (ref >59)

## 2021-11-09 LAB — LIPID PANEL
Chol/HDL Ratio: 3.3 ratio (ref 0.0–4.4)
Cholesterol, Total: 152 mg/dL (ref 100–199)
HDL: 46 mg/dL (ref >39)
LDL Chol Calc (NIH): 76 mg/dL (ref 0–99)
Triglycerides: 178 mg/dL — ABNORMAL HIGH (ref 0–149)
VLDL Cholesterol Cal: 30 mg/dL (ref 5–40)

## 2021-11-09 LAB — LEVETIRACETAM LEVEL: Levetiracetam Lvl: 10.2 ug/mL (ref 10.0–40.0)

## 2021-11-09 LAB — T4, FREE: Free T4: 1.26 ng/dL (ref 0.82–1.77)

## 2021-11-09 LAB — TSH: TSH: 1.79 u[IU]/mL (ref 0.450–4.500)

## 2021-11-09 LAB — SPECIMEN STATUS REPORT

## 2021-11-09 LAB — VITAMIN D 25 HYDROXY (VIT D DEFICIENCY, FRACTURES): Vit D, 25-Hydroxy: 44.3 ng/mL (ref 30.0–100.0)

## 2021-11-09 LAB — PHENYTOIN LEVEL, FREE: Phenytoin, Free: 0.8 ug/mL — ABNORMAL LOW (ref 1.0–2.0)

## 2021-11-10 LAB — SPECIMEN STATUS REPORT

## 2021-11-10 LAB — HGB A1C W/O EAG: Hgb A1c MFr Bld: 5.8 % — ABNORMAL HIGH (ref 4.8–5.6)

## 2021-11-14 ENCOUNTER — Other Ambulatory Visit (HOSPITAL_COMMUNITY): Payer: Self-pay

## 2021-11-15 ENCOUNTER — Other Ambulatory Visit (HOSPITAL_COMMUNITY): Payer: Self-pay

## 2021-12-14 DIAGNOSIS — H5203 Hypermetropia, bilateral: Secondary | ICD-10-CM | POA: Diagnosis not present

## 2021-12-14 DIAGNOSIS — H524 Presbyopia: Secondary | ICD-10-CM | POA: Diagnosis not present

## 2021-12-14 DIAGNOSIS — H52223 Regular astigmatism, bilateral: Secondary | ICD-10-CM | POA: Diagnosis not present

## 2021-12-22 DIAGNOSIS — M170 Bilateral primary osteoarthritis of knee: Secondary | ICD-10-CM | POA: Diagnosis not present

## 2022-01-06 ENCOUNTER — Encounter: Payer: Self-pay | Admitting: Family Medicine

## 2022-01-12 ENCOUNTER — Other Ambulatory Visit (HOSPITAL_COMMUNITY): Payer: Self-pay

## 2022-01-25 ENCOUNTER — Encounter: Payer: Self-pay | Admitting: Family Medicine

## 2022-01-25 ENCOUNTER — Telehealth (INDEPENDENT_AMBULATORY_CARE_PROVIDER_SITE_OTHER): Payer: Commercial Managed Care - PPO | Admitting: Family Medicine

## 2022-01-25 DIAGNOSIS — B9689 Other specified bacterial agents as the cause of diseases classified elsewhere: Secondary | ICD-10-CM | POA: Diagnosis not present

## 2022-01-25 DIAGNOSIS — J019 Acute sinusitis, unspecified: Secondary | ICD-10-CM | POA: Diagnosis not present

## 2022-01-25 MED ORDER — DOXYCYCLINE HYCLATE 100 MG PO TABS: 100.0000 mg | ORAL_TABLET | Freq: Two times a day (BID) | ORAL | 0 refills | Status: AC

## 2022-01-25 NOTE — Progress Notes (Signed)
Telephone visit  Subjective: CC:URI PCP: Maria Norlander, DO QBH:ALPFX B Maria Rivers is a 62 y.o. female. Patient provides verbal consent for consult held via telephone.  Due to COVID-19 pandemic this visit was conducted virtually. This visit type was conducted due to national recommendations for restrictions regarding the COVID-19 Pandemic (e.g. social distancing, sheltering in place) in an effort to limit this patient's exposure and mitigate transmission in our community. All issues noted in this document were discussed and addressed.  A physical exam was not performed with this format.   Location of patient: home Location of provider: [facility] Others present for call: none  1. Sinusitis Multiple COVID exposure.  She has home tested for COVID x4 days.  She reports sinus pressure over maxilla and into her right jaw.  She reports rhinorrhea.  She is using Mucinex.  No fevers.  ROS: Per HPI  Allergies  Allergen Reactions   Mushroom Extract Complex Swelling   Penicillins     UNKNOWN REACTION   Shellfish Allergy Swelling   Tamiflu [Oseltamivir Phosphate] Rash    Severe rash, hives   Past Medical History:  Diagnosis Date   Hyperlipidemia    Hypertension    Seizures (HCC)     Current Outpatient Medications:    doxycycline (VIBRA-TABS) 100 MG tablet, Take 1 tablet (100 mg total) by mouth 2 (two) times daily for 14 days., Disp: 28 tablet, Rfl: 0   cholecalciferol (VITAMIN D) 1000 UNITS tablet, Take 2,000 Units by mouth daily. , Disp: , Rfl:    DILANTIN 100 MG ER capsule, TAKE 3 CAPSULES BY MOUTH 2 TIMES DAILY., Disp: 540 capsule, Rfl: 3   hydrochlorothiazide (HYDRODIURIL) 25 MG tablet, Take 1 tablet (25 mg total) by mouth daily., Disp: 90 tablet, Rfl: 3   ibuprofen (ADVIL,MOTRIN) 200 MG tablet, Take 200 mg by mouth 2 (two) times daily. For pain, Disp: , Rfl:    levETIRAcetam (KEPPRA) 500 MG tablet, Take 1/2 tablet by mouth in the morning and take 2 tablets in the evening., Disp: 225 tablet,  Rfl: 4   levothyroxine (SYNTHROID) 25 MCG tablet, TAKE 1 TABLET BY MOUTH ONCE DAILY, Disp: 90 tablet, Rfl: 3   lisinopril (ZESTRIL) 10 MG tablet, Take 1 tablet (10 mg total) by mouth daily., Disp: 90 tablet, Rfl: 3   loratadine (CLARITIN) 10 MG tablet, Take 10 mg by mouth daily., Disp: , Rfl:    Multiple Vitamin (MULTIVITAMIN WITH MINERALS) TABS tablet, Take 1 tablet by mouth daily., Disp: , Rfl:    rosuvastatin (CRESTOR) 20 MG tablet, TAKE 1 TABLET BY MOUTH ONCE DAILY, Disp: 90 tablet, Rfl: 3  Assessment/ Plan: 63 y.o. female   Acute bacterial sinusitis - Plan: doxycycline (VIBRA-TABS) 100 MG tablet  Treatment for presumed acute bacterial sinusitis given clinical presentation.  Instructions were reviewed with the patient.  Discussed ongoing testing for COVID-19.  If becomes positive within the next 24 hours she still within the realm of treatment.  She will let me know.  Start time: 12:54pm End time: 1:00pm  Total time spent on patient care (including video visit/ documentation): 6 minutes  [place], [place] [phone]

## 2022-01-27 ENCOUNTER — Encounter: Payer: Self-pay | Admitting: Family Medicine

## 2022-01-27 MED ORDER — BENZONATATE 100 MG PO CAPS: 100.0000 mg | ORAL_CAPSULE | Freq: Three times a day (TID) | ORAL | 0 refills | Status: DC | PRN

## 2022-01-30 ENCOUNTER — Other Ambulatory Visit (HOSPITAL_COMMUNITY): Payer: Self-pay

## 2022-01-30 ENCOUNTER — Other Ambulatory Visit: Payer: Self-pay

## 2022-01-31 ENCOUNTER — Other Ambulatory Visit (HOSPITAL_COMMUNITY): Payer: Self-pay

## 2022-01-31 ENCOUNTER — Encounter: Payer: Self-pay | Admitting: Pharmacist

## 2022-01-31 ENCOUNTER — Other Ambulatory Visit: Payer: Self-pay

## 2022-02-01 ENCOUNTER — Encounter (HOSPITAL_COMMUNITY): Payer: Self-pay

## 2022-02-01 ENCOUNTER — Other Ambulatory Visit: Payer: Self-pay

## 2022-02-01 ENCOUNTER — Other Ambulatory Visit (HOSPITAL_COMMUNITY): Payer: Self-pay

## 2022-02-03 ENCOUNTER — Other Ambulatory Visit: Payer: Self-pay

## 2022-02-06 ENCOUNTER — Other Ambulatory Visit (HOSPITAL_COMMUNITY): Payer: Self-pay

## 2022-02-06 ENCOUNTER — Other Ambulatory Visit: Payer: Self-pay

## 2022-02-07 ENCOUNTER — Other Ambulatory Visit (HOSPITAL_COMMUNITY): Payer: Self-pay

## 2022-02-15 ENCOUNTER — Encounter: Payer: Self-pay | Admitting: Neurology

## 2022-02-16 ENCOUNTER — Encounter: Payer: Self-pay | Admitting: Family Medicine

## 2022-02-19 ENCOUNTER — Encounter: Payer: Self-pay | Admitting: Neurology

## 2022-02-22 ENCOUNTER — Encounter: Payer: Self-pay | Admitting: Neurology

## 2022-02-22 ENCOUNTER — Telehealth: Payer: Self-pay | Admitting: Neurology

## 2022-02-22 DIAGNOSIS — G40309 Generalized idiopathic epilepsy and epileptic syndromes, not intractable, without status epilepticus: Secondary | ICD-10-CM

## 2022-02-22 NOTE — Progress Notes (Signed)
We did no proceed with visit. We could not connect virtually. Unsure about proceeding on telephone since insurance no longer covering telephone visits. She will reschedule for office visit. Her refills appear current.

## 2022-02-23 NOTE — Telephone Encounter (Signed)
Pt rescheduled for in office f/u with Sarah for 02/28/22 at 9:45am

## 2022-02-28 ENCOUNTER — Other Ambulatory Visit: Payer: Self-pay

## 2022-02-28 ENCOUNTER — Ambulatory Visit (INDEPENDENT_AMBULATORY_CARE_PROVIDER_SITE_OTHER): Payer: Commercial Managed Care - PPO | Admitting: Neurology

## 2022-02-28 ENCOUNTER — Encounter: Payer: Self-pay | Admitting: Neurology

## 2022-02-28 DIAGNOSIS — G40309 Generalized idiopathic epilepsy and epileptic syndromes, not intractable, without status epilepticus: Secondary | ICD-10-CM

## 2022-02-28 MED ORDER — LEVETIRACETAM 500 MG PO TABS
ORAL_TABLET | ORAL | 4 refills | Status: DC
  Filled 2022-02-28 – 2022-04-23 (×2): qty 225, 90d supply, fill #0
  Filled 2022-07-28: qty 225, 90d supply, fill #1
  Filled 2022-10-13: qty 225, 90d supply, fill #2
  Filled 2023-01-23: qty 225, 90d supply, fill #3
  Filled 2023-02-26: qty 225, 90d supply, fill #4

## 2022-02-28 MED ORDER — DILANTIN 100 MG PO CAPS
300.0000 mg | ORAL_CAPSULE | Freq: Two times a day (BID) | ORAL | 3 refills | Status: DC
  Filled 2022-02-28 – 2022-04-23 (×2): qty 540, 90d supply, fill #0
  Filled 2022-07-25: qty 540, 90d supply, fill #1
  Filled 2022-11-04: qty 540, 90d supply, fill #2
  Filled 2023-02-10 – 2023-02-13 (×6): qty 180, 30d supply, fill #3
  Filled 2023-02-26: qty 180, 30d supply, fill #4

## 2022-02-28 NOTE — Progress Notes (Signed)
PATIENT: Maria Rivers DOB: 10/05/1960  REASON FOR VISIT: follow up for seizures HISTORY FROM: patient PRIMARY NEUROLOGIST: Dr. April Manson   HISTORY OF PRESENT ILLNESS: Today 02/28/22 Here today for follow-up.  Doing well.  Labs 11/07/21 Keppra level 10.2, Dilantin free level 0.8.  CMP normal except glucose 141, CBC normal.  Vitamin D 44.3. thinks she may have missed her dosage timeframe for her Dilantin. Her 1st seizure was around 1992, on Dilantin since then. 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 Originally her seizures were generalized, then become more partial type, which was most recent in 2018. Takes Vitamin D. Pays $ 100 for 3 month supply for brand name Dilantin. In past tried generic Dilantin, had back to back seizures. Working a lot of hours as Therapist, sports due to Production manager.  Update  02/17/21 SS: Maria Rivers is here today for follow-up with history of well-controlled seizures on Dilantin (brand name) and Keppra.  In December PCP check labs, Keppra level was 19.5, Dilantin level free was 0.9 (had missed a few doses at the time of lab draw got busy at work).  Creatinine was 1.25. Working as Therapist, sports at [employer], full time, working overtime due to Pensions consultant. Last seizure was in 2018, then Keppra was started, no further events. Seizures have been staring off, can maintain function, tells me about a seizure she had a church taking notes, her notes were perfect. Reviewed CBC, CMP in Dec, creatinine was 1.25.   UPDATE 12/08/2019 SS: Maria Rivers is a 62 year old female history of seizures on Dilantin and Keppra.  Seizures have been well controlled.  She works as a Therapist, sports at the [employer], loves her job.  No seizures in several years, at that time, Keppra was added, she had lost weight on Dilantin.  She has orthopedic issues, bone-on-bone both knees.  Is on vitamin D supplementation.  PCP check blood work in August 2021, Free Dilantin level was 1.2, Keppra level 9.4.  Presents today for  evaluation unaccompanied.  HISTORY  10/24/2018 Dr. Jannifer Franklin: Maria Rivers is a 62 year old right-handed white female with a history of seizures that have been under relatively good control with the use of Dilantin and Keppra.  The patient is on vitamin D supplementation.  She has had recent blood levels done with a therapeutic Keppra level of 27.0 and a free Dilantin level of 1.1 that is therapeutic.  The patient tolerates the medications well.  She is able to operate a motor vehicle without difficulty.  She is followed through orthopedic surgery for her knee arthritis and requires injections on occasion.  She returns to the office today for an evaluation.  REVIEW OF SYSTEMS: Out of a complete 14 system review of symptoms, the patient complains only of the following symptoms, and all other reviewed systems are negative.  See HPI  ALLERGIES: Allergies  Allergen Reactions   Mushroom Extract Complex Swelling   Penicillins     UNKNOWN REACTION   Shellfish Allergy Swelling   Tamiflu [Oseltamivir Phosphate] Rash    Severe rash, hives    HOME MEDICATIONS: Outpatient Medications Prior to Visit  Medication Sig Dispense Refill   cholecalciferol (VITAMIN D) 1000 UNITS tablet Take 2,000 Units by mouth daily.      hydrochlorothiazide (HYDRODIURIL) 25 MG tablet Take 1 tablet (25 mg total) by mouth daily. 90 tablet 3   ibuprofen (ADVIL,MOTRIN) 200 MG tablet Take 200 mg by mouth 2 (two) times daily. For pain     levothyroxine (  SYNTHROID) 25 MCG tablet TAKE 1 TABLET BY MOUTH ONCE DAILY 90 tablet 3   lisinopril (ZESTRIL) 10 MG tablet Take 1 tablet (10 mg total) by mouth daily. 90 tablet 3   loratadine (CLARITIN) 10 MG tablet Take 10 mg by mouth daily.     Multiple Vitamin (MULTIVITAMIN WITH MINERALS) TABS tablet Take 1 tablet by mouth daily.     rosuvastatin (CRESTOR) 20 MG tablet TAKE 1 TABLET BY MOUTH ONCE DAILY 90 tablet 3   DILANTIN 100 MG ER capsule TAKE 3 CAPSULES BY MOUTH 2 TIMES DAILY. 540 capsule 3    levETIRAcetam (KEPPRA) 500 MG tablet Take 1/2 tablet by mouth in the morning and take 2 tablets in the evening. 225 tablet 4   benzonatate (TESSALON PERLES) 100 MG capsule Take 1 capsule (100 mg total) by mouth 3 (three) times daily as needed. 20 capsule 0   No facility-administered medications prior to visit.    PAST MEDICAL HISTORY: Past Medical History:  Diagnosis Date   Hyperlipidemia    Hypertension    Seizures ([place])     PAST SURGICAL HISTORY: History reviewed. No pertinent surgical history.  FAMILY HISTORY: Family History  Problem Relation Age of Onset   Diabetes Other     SOCIAL HISTORY: Social History   Socioeconomic History   Marital status: Married    Spouse name: Not on file   Number of children: Not on file   Years of education: Not on file   Highest education level: Not on file  Occupational History    Comment: RN [employer]  Tobacco Use   Smoking status: Never   Smokeless tobacco: Never  Vaping Use   Vaping Use: Never used  Substance and Sexual Activity   Alcohol use: No   Drug use: No   Sexual activity: Yes    Birth control/protection: None  Other Topics Concern   Not on file  Social History Narrative   Lives at home with husband and 2 cats   Right handed   Caffeine: avg 2 cups/day diet mtn dew (no coffee)   Social Determinants of Health   Financial Resource Strain: Not on file  Food Insecurity: Not on file  Transportation Needs: Not on file  Physical Activity: Not on file  Stress: Not on file  Social Connections: Not on file  Intimate Partner Violence: Not on file   PHYSICAL EXAM  Vitals:   02/28/22 0941  BP: 120/73  Pulse: 91  Weight: 290 lb (131.5 kg)  Height: 5' 11"$  (1.803 m)   Body mass index is 40.45 kg/m.  Generalized: Well developed, in no acute distress   Neurological examination  Mentation: Alert oriented to time, place, history taking. Follows all commands speech and language fluent Cranial nerve II-XII: Pupils were  equal round reactive to light. Extraocular movements were full, visual field were full on confrontational test. Facial sensation and strength were normal. Head turning and shoulder shrug  were normal and symmetric. Motor: The motor testing reveals 5 over 5 strength of all 4 extremities. Good symmetric motor tone is noted throughout.  Sensory: Sensory testing is intact to soft touch on all 4 extremities. No evidence of extinction is noted.  Coordination: Cerebellar testing reveals good finger-nose-finger and heel-to-shin bilaterally.  Gait and station: Gait is slightly wide-based, steady, antalgic   DIAGNOSTIC DATA (LABS, IMAGING, TESTING) - I reviewed patient records, labs, notes, testing and imaging myself where available.  Lab Results  Component Value Date   WBC 7.6 11/07/2021  HGB 14.7 11/07/2021   HCT 42.7 11/07/2021   MCV 89 11/07/2021   PLT 228 11/07/2021      Component Value Date/Time   NA 137 11/07/2021 1613   K 4.1 11/07/2021 1613   CL 97 11/07/2021 1613   CO2 25 11/07/2021 1613   GLUCOSE 141 (H) 11/07/2021 1613   BUN 16 11/07/2021 1613   CREATININE 0.90 11/07/2021 1613   CALCIUM 9.7 11/07/2021 1613   PROT 7.2 11/07/2021 1613   ALBUMIN 4.5 11/07/2021 1613   AST 18 11/07/2021 1613   ALT 18 11/07/2021 1613   ALKPHOS 98 11/07/2021 1613   BILITOT 0.2 11/07/2021 1613   GFRNONAA 49 (L) 09/02/2019 1610   GFRAA 56 (L) 09/02/2019 1610   Lab Results  Component Value Date   CHOL 152 11/07/2021   HDL 46 11/07/2021   LDLCALC 76 11/07/2021   TRIG 178 (H) 11/07/2021   CHOLHDL 3.3 11/07/2021   Lab Results  Component Value Date   HGBA1C 5.8 (H) 11/07/2021   No results found for: "VITAMINB12" Lab Results  Component Value Date   TSH 1.790 11/07/2021    ASSESSMENT AND PLAN 62 y.o. year old female  has a past medical history of Hyperlipidemia, Hypertension, and Seizures ([place]). here with:  1.  History of seizures, well controlled  -Continues to do well, last seizure was  in 2018 -I reviewed labs from PCP in October 2023 -We will continue current dose of brand-name Dilantin, generic Keppra -She will follow-up in 1 year with Dr. April Manson to establish care, evaluate long-term seizure medications -She is on oral vitamin D supplement   Meds ordered this encounter  Medications   levETIRAcetam (KEPPRA) 500 MG tablet    Sig: Take 1/2 tablet by mouth in the morning and take 2 tablets in the evening.    Dispense:  225 tablet    Refill:  4    Void brand name Keppra refills. Pt is stable to generic.   DILANTIN 100 MG ER capsule    Sig: TAKE 3 CAPSULES BY MOUTH 2 TIMES DAILY.    Dispense:  540 capsule    Refill:  3   Butler Denmark, [place], DNP 02/28/2022, 10:20 AM [facility] [address] [place], [place] [address] [phone]

## 2022-03-02 ENCOUNTER — Other Ambulatory Visit (HOSPITAL_COMMUNITY): Payer: Self-pay

## 2022-04-19 ENCOUNTER — Other Ambulatory Visit: Payer: Self-pay

## 2022-04-19 ENCOUNTER — Other Ambulatory Visit (HOSPITAL_COMMUNITY): Payer: Self-pay

## 2022-04-23 ENCOUNTER — Other Ambulatory Visit (HOSPITAL_COMMUNITY): Payer: Self-pay

## 2022-04-24 ENCOUNTER — Other Ambulatory Visit (HOSPITAL_COMMUNITY): Payer: Self-pay

## 2022-04-24 ENCOUNTER — Other Ambulatory Visit: Payer: Self-pay

## 2022-04-25 ENCOUNTER — Other Ambulatory Visit (HOSPITAL_COMMUNITY): Payer: Self-pay

## 2022-04-25 ENCOUNTER — Other Ambulatory Visit: Payer: Self-pay

## 2022-07-26 ENCOUNTER — Encounter (HOSPITAL_COMMUNITY): Payer: Self-pay

## 2022-07-26 ENCOUNTER — Other Ambulatory Visit (HOSPITAL_COMMUNITY): Payer: Self-pay

## 2022-07-27 ENCOUNTER — Other Ambulatory Visit (HOSPITAL_COMMUNITY): Payer: Self-pay

## 2022-07-27 DIAGNOSIS — M170 Bilateral primary osteoarthritis of knee: Secondary | ICD-10-CM | POA: Diagnosis not present

## 2022-07-28 ENCOUNTER — Other Ambulatory Visit (HOSPITAL_COMMUNITY): Payer: Self-pay

## 2022-07-29 ENCOUNTER — Other Ambulatory Visit (HOSPITAL_COMMUNITY): Payer: Self-pay

## 2022-08-03 ENCOUNTER — Other Ambulatory Visit (HOSPITAL_COMMUNITY): Payer: Self-pay

## 2022-09-07 ENCOUNTER — Other Ambulatory Visit (HOSPITAL_COMMUNITY): Payer: Self-pay

## 2022-10-05 ENCOUNTER — Other Ambulatory Visit (HOSPITAL_COMMUNITY): Payer: Self-pay

## 2022-10-06 ENCOUNTER — Other Ambulatory Visit (HOSPITAL_COMMUNITY): Payer: Self-pay

## 2022-10-13 ENCOUNTER — Encounter: Payer: Self-pay | Admitting: Family Medicine

## 2022-10-13 ENCOUNTER — Other Ambulatory Visit: Payer: Self-pay | Admitting: Family Medicine

## 2022-10-13 DIAGNOSIS — E782 Mixed hyperlipidemia: Secondary | ICD-10-CM

## 2022-10-13 DIAGNOSIS — E039 Hypothyroidism, unspecified: Secondary | ICD-10-CM

## 2022-10-14 ENCOUNTER — Other Ambulatory Visit (HOSPITAL_COMMUNITY): Payer: Self-pay

## 2022-10-16 ENCOUNTER — Other Ambulatory Visit: Payer: Self-pay

## 2022-10-16 ENCOUNTER — Other Ambulatory Visit (HOSPITAL_COMMUNITY): Payer: Self-pay

## 2022-10-16 DIAGNOSIS — E782 Mixed hyperlipidemia: Secondary | ICD-10-CM

## 2022-10-16 DIAGNOSIS — E039 Hypothyroidism, unspecified: Secondary | ICD-10-CM

## 2022-10-16 MED ORDER — LEVOTHYROXINE SODIUM 25 MCG PO TABS
25.0000 ug | ORAL_TABLET | Freq: Every day | ORAL | 0 refills | Status: DC
  Filled 2022-10-16: qty 90, 90d supply, fill #0

## 2022-10-16 MED ORDER — ROSUVASTATIN CALCIUM 20 MG PO TABS
ORAL_TABLET | Freq: Every day | ORAL | 0 refills | Status: DC
  Filled 2022-10-16: qty 90, 90d supply, fill #0

## 2022-10-16 MED ORDER — ROSUVASTATIN CALCIUM 20 MG PO TABS
ORAL_TABLET | Freq: Every day | ORAL | 0 refills | Status: DC
  Filled 2022-10-16: qty 90, fill #0

## 2022-11-04 ENCOUNTER — Other Ambulatory Visit (HOSPITAL_COMMUNITY): Payer: Self-pay

## 2022-11-06 ENCOUNTER — Other Ambulatory Visit (HOSPITAL_COMMUNITY): Payer: Self-pay

## 2022-11-06 ENCOUNTER — Other Ambulatory Visit: Payer: Self-pay

## 2022-11-07 ENCOUNTER — Other Ambulatory Visit: Payer: Self-pay

## 2022-11-08 ENCOUNTER — Other Ambulatory Visit: Payer: Self-pay

## 2022-11-10 ENCOUNTER — Encounter: Payer: Commercial Managed Care - PPO | Admitting: Family Medicine

## 2022-11-13 ENCOUNTER — Encounter: Payer: Self-pay | Admitting: Family Medicine

## 2022-11-14 ENCOUNTER — Other Ambulatory Visit (HOSPITAL_COMMUNITY): Payer: Self-pay

## 2022-11-14 ENCOUNTER — Encounter: Payer: Self-pay | Admitting: Family Medicine

## 2022-11-14 ENCOUNTER — Ambulatory Visit (INDEPENDENT_AMBULATORY_CARE_PROVIDER_SITE_OTHER): Payer: Commercial Managed Care - PPO | Admitting: Family Medicine

## 2022-11-14 VITALS — BP 122/64 | HR 89 | Temp 98.6°F | Ht 71.0 in | Wt 278.0 lb

## 2022-11-14 DIAGNOSIS — Z23 Encounter for immunization: Secondary | ICD-10-CM

## 2022-11-14 DIAGNOSIS — E039 Hypothyroidism, unspecified: Secondary | ICD-10-CM | POA: Diagnosis not present

## 2022-11-14 DIAGNOSIS — E782 Mixed hyperlipidemia: Secondary | ICD-10-CM

## 2022-11-14 DIAGNOSIS — Z5320 Procedure and treatment not carried out because of patient's decision for unspecified reasons: Secondary | ICD-10-CM | POA: Diagnosis not present

## 2022-11-14 DIAGNOSIS — Z0000 Encounter for general adult medical examination without abnormal findings: Secondary | ICD-10-CM

## 2022-11-14 DIAGNOSIS — I10 Essential (primary) hypertension: Secondary | ICD-10-CM

## 2022-11-14 DIAGNOSIS — Z114 Encounter for screening for human immunodeficiency virus [HIV]: Secondary | ICD-10-CM

## 2022-11-14 DIAGNOSIS — G40909 Epilepsy, unspecified, not intractable, without status epilepticus: Secondary | ICD-10-CM | POA: Diagnosis not present

## 2022-11-14 DIAGNOSIS — Z0001 Encounter for general adult medical examination with abnormal findings: Secondary | ICD-10-CM

## 2022-11-14 DIAGNOSIS — Z1159 Encounter for screening for other viral diseases: Secondary | ICD-10-CM

## 2022-11-14 MED ORDER — LISINOPRIL 10 MG PO TABS
10.0000 mg | ORAL_TABLET | Freq: Every day | ORAL | 3 refills | Status: DC
  Filled 2022-11-14: qty 90, 90d supply, fill #0
  Filled 2023-02-08: qty 90, 90d supply, fill #1
  Filled 2023-05-19: qty 90, 90d supply, fill #2
  Filled 2023-08-26 – 2023-08-27 (×2): qty 90, 90d supply, fill #3

## 2022-11-14 MED ORDER — ROSUVASTATIN CALCIUM 20 MG PO TABS
20.0000 mg | ORAL_TABLET | Freq: Every day | ORAL | 3 refills | Status: DC
  Filled 2022-11-14: qty 90, fill #0
  Filled 2023-01-23: qty 90, 90d supply, fill #0
  Filled 2023-04-21: qty 90, 90d supply, fill #1
  Filled 2023-07-20 – 2023-07-23 (×2): qty 90, 90d supply, fill #2
  Filled 2023-10-26: qty 90, 90d supply, fill #3

## 2022-11-14 MED ORDER — DOXYCYCLINE HYCLATE 100 MG PO TABS
100.0000 mg | ORAL_TABLET | Freq: Two times a day (BID) | ORAL | 0 refills | Status: AC
Start: 2022-11-14 — End: 2022-11-22
  Filled 2022-11-14: qty 14, 7d supply, fill #0

## 2022-11-14 MED ORDER — HYDROCHLOROTHIAZIDE 25 MG PO TABS
25.0000 mg | ORAL_TABLET | Freq: Every day | ORAL | 3 refills | Status: DC
  Filled 2022-11-14: qty 90, 90d supply, fill #0
  Filled 2023-01-23 – 2023-01-25 (×2): qty 90, 90d supply, fill #1
  Filled 2023-04-21: qty 90, 90d supply, fill #2
  Filled 2023-07-20 – 2023-07-23 (×2): qty 90, 90d supply, fill #3

## 2022-11-14 MED ORDER — LEVOTHYROXINE SODIUM 25 MCG PO TABS
25.0000 ug | ORAL_TABLET | Freq: Every day | ORAL | 3 refills | Status: DC
  Filled 2022-11-14 – 2023-01-23 (×2): qty 90, 90d supply, fill #0
  Filled 2023-04-21: qty 90, 90d supply, fill #1
  Filled 2023-07-20 – 2023-07-23 (×2): qty 90, 90d supply, fill #2
  Filled 2023-10-26: qty 90, 90d supply, fill #3

## 2022-11-14 NOTE — Patient Instructions (Signed)
Tell Maria Rivers on that baby for me!

## 2022-11-14 NOTE — Progress Notes (Signed)
Maria Rivers is a 62 y.o. female presents to office today for annual physical exam examination.    Concerns today include: 1. None. Needs refills/ labs.  Hoping to come off Dilantin next year. Just had a new nephew.  Her niece Shanda Bumps just delivered last night.  Occupation: works at a nursing home, Marital status: married, Substance use: none Health Maintenance Due  Topic Date Due   Hepatitis C Screening  Never done   INFLUENZA VACCINE  08/17/2022   Refills needed today:   Immunization History  Administered Date(s) Administered   Influenza,inj,Quad PF,6+ Mos 10/18/2018, 11/14/2019, 10/06/2020, 11/07/2021   Influenza,inj,quad, With Preservative 11/02/2017   Influenza-Unspecified 10/20/2016, 11/02/2017   Moderna Sars-Covid-2 Vaccination 01/13/2019, 02/11/2019, 09/10/2019, 11/09/2020   Tdap 02/16/2021   Past Medical History:  Diagnosis Date   Hyperlipidemia    Hypertension    Seizures ([facility])    Social History   Socioeconomic History   Marital status: Married    Spouse name: Not on file   Number of children: Not on file   Years of education: Not on file   Highest education level: Not on file  Occupational History    Comment: RN [employer]  Tobacco Use   Smoking status: Never   Smokeless tobacco: Never  Vaping Use   Vaping status: Never Used  Substance and Sexual Activity   Alcohol use: No   Drug use: No   Sexual activity: Yes    Birth control/protection: None  Other Topics Concern   Not on file  Social History Narrative   Lives at home with husband and 2 cats   Right handed   Caffeine: avg 2 cups/day diet mtn dew (no coffee)   Social Determinants of Health   Financial Resource Strain: Not on file  Food Insecurity: Not on file  Transportation Needs: Not on file  Physical Activity: Not on file  Stress: Not on file  Social Connections: Not on file  Intimate Partner Violence: Not on file   History reviewed. No pertinent surgical history. Family History   Problem Relation Age of Onset   Diabetes Other     Current Outpatient Medications:    cholecalciferol (VITAMIN D) 1000 UNITS tablet, Take 2,000 Units by mouth daily. , Disp: , Rfl:    DILANTIN 100 MG ER capsule, Take 3 capsules (300 mg total) by mouth 2 (two) times daily., Disp: 540 capsule, Rfl: 3   ibuprofen (ADVIL,MOTRIN) 200 MG tablet, Take 200 mg by mouth 2 (two) times daily. For pain, Disp: , Rfl:    levETIRAcetam (KEPPRA) 500 MG tablet, Take 1/2 tablet by mouth in the morning and take 2 tablets in the evening., Disp: 225 tablet, Rfl: 4   loratadine (CLARITIN) 10 MG tablet, Take 10 mg by mouth daily., Disp: , Rfl:    Multiple Vitamin (MULTIVITAMIN WITH MINERALS) TABS tablet, Take 1 tablet by mouth daily., Disp: , Rfl:    hydrochlorothiazide (HYDRODIURIL) 25 MG tablet, Take 1 tablet (25 mg total) by mouth daily., Disp: 90 tablet, Rfl: 3   levothyroxine (SYNTHROID) 25 MCG tablet, TAKE 1 TABLET BY MOUTH ONCE DAILY, Disp: 90 tablet, Rfl: 3   lisinopril (ZESTRIL) 10 MG tablet, Take 1 tablet (10 mg total) by mouth daily., Disp: 90 tablet, Rfl: 3   rosuvastatin (CRESTOR) 20 MG tablet, TAKE 1 TABLET BY MOUTH ONCE DAILY, Disp: 90 tablet, Rfl: 3  Allergies  Allergen Reactions   Mushroom Extract Complex Swelling   Penicillins     UNKNOWN REACTION  Shellfish Allergy Swelling   Tamiflu [Oseltamivir Phosphate] Rash    Severe rash, hives     ROS: Review of Systems A comprehensive review of systems was negative except for: Eyes: positive for contacts/glasses Musculoskeletal: positive for leg pain/ stiffness, wears braced    Physical exam BP 122/64   Pulse 89   Temp 98.6 F (37 C)   Ht 5\' 11"  (1.803 m)   Wt 278 lb (126.1 kg)   LMP 03/16/2011   SpO2 94%   BMI 38.77 kg/m  General appearance: alert, cooperative, appears stated age, and no distress Head: Normocephalic, without obvious abnormality, atraumatic Eyes: negative findings: lids and lashes normal, conjunctivae and sclerae  normal, corneas clear, and pupils equal, round, reactive to light and accomodation Ears: normal TM's and external ear canals both ears Nose: Nares normal. Septum midline. Mucosa normal. No drainage or sinus tenderness. Throat: lips, mucosa, and tongue normal; teeth and gums normal Neck: no adenopathy, supple, symmetrical, trachea midline, and thyroid not enlarged, symmetric, no tenderness/mass/nodules Back: symmetric, no curvature. ROM normal. No CVA tenderness. Lungs: clear to auscultation bilaterally Heart: regular rate and rhythm, S1, S2 normal, no murmur, click, rub or gallop Abdomen:  obese, soft, NT/ND, small subcutaneous cyst appreciated in the LUQ Extremities:  Warm Pulses: 2+ and symmetric Skin:  no lesions seen Lymph nodes: Cervical, supraclavicular, and axillary nodes normal. Neurologic: Grossly normal      11/14/2022    4:07 PM 11/07/2021    3:42 PM 11/08/2020    3:27 PM  Depression screen PHQ 2/9  Decreased Interest 0 0 0  Down, Depressed, Hopeless 0 0 0  PHQ - 2 Score 0 0 0  Altered sleeping 0  0  Tired, decreased energy 0  0  Change in appetite 0  0  Feeling bad or failure about yourself  0  0  Trouble concentrating 0  0  Moving slowly or fidgety/restless 0  0  Suicidal thoughts 0  0  PHQ-9 Score 0  0  Difficult doing work/chores Not difficult at all  Somewhat difficult      11/07/2021    3:42 PM 11/08/2020    3:27 PM  GAD 7 : Generalized Anxiety Score  Nervous, Anxious, on Edge 0 0  Control/stop worrying 0 0  Worry too much - different things 0 0  Trouble relaxing 0 0  Restless 0 0  Easily annoyed or irritable 0 0  Afraid - awful might happen 0 0  Total GAD 7 Score 0 0  Anxiety Difficulty Not difficult at all Not difficult at all     Assessment/ Plan: Maria Rivers here for annual physical exam.   Annual physical exam  Need for immunization against influenza  Cervical cancer screening declined  Colon cancer screening declined  Essential  hypertension - Plan: CMP14+EGFR, hydrochlorothiazide (HYDRODIURIL) 25 MG tablet, lisinopril (ZESTRIL) 10 MG tablet  Mixed hyperlipidemia - Plan: CMP14+EGFR, Lipid Panel, TSH, rosuvastatin (CRESTOR) 20 MG tablet  Morbid obesity due to excess calories (HCC) - Plan: CMP14+EGFR, Lipid Panel, Bayer DCA Hb A1c Waived  Seizure disorder (HCC) - Plan: CBC with Differential, Levetiracetam level, Phenytoin level, free and total  Acquired hypothyroidism - Plan: TSH, T4, Free, levothyroxine (SYNTHROID) 25 MCG tablet  Screening for HIV without presence of risk factors - Plan: HIV antibody (with reflex)  Encounter for hepatitis C screening test for low risk patient - Plan: Hepatitis C antibody  Labs collected today.  Will CC to neurology as FYI.  Medications have been  renewed.  Blood pressure well-controlled.  Asymptomatic from a thyroid standpoint.  Declines all preventative cancer screening.  Handout on healthy lifestyle choices, including diet (rich in fruits, vegetables and lean meats and low in salt and simple carbohydrates) and exercise (at least 30 minutes of moderate physical activity daily).  Patient to follow up 1 year for CPE  Jaycey Gens M. Nadine Counts, DO

## 2022-11-15 ENCOUNTER — Other Ambulatory Visit (HOSPITAL_COMMUNITY): Payer: Self-pay

## 2022-11-15 ENCOUNTER — Encounter: Payer: Self-pay | Admitting: Family Medicine

## 2022-11-15 ENCOUNTER — Other Ambulatory Visit: Payer: Self-pay

## 2022-11-15 LAB — CMP14+EGFR
ALT: 17 [IU]/L (ref 0–32)
AST: 17 [IU]/L (ref 0–40)
Albumin: 4.2 g/dL (ref 3.9–4.9)
Alkaline Phosphatase: 87 [IU]/L (ref 44–121)
BUN/Creatinine Ratio: 26 (ref 12–28)
BUN: 26 mg/dL (ref 8–27)
Bilirubin Total: 0.2 mg/dL (ref 0.0–1.2)
CO2: 23 mmol/L (ref 20–29)
Calcium: 9.5 mg/dL (ref 8.7–10.3)
Chloride: 102 mmol/L (ref 96–106)
Creatinine, Ser: 1.01 mg/dL — ABNORMAL HIGH (ref 0.57–1.00)
Globulin, Total: 2.8 g/dL (ref 1.5–4.5)
Glucose: 97 mg/dL (ref 70–99)
Potassium: 4.5 mmol/L (ref 3.5–5.2)
Sodium: 139 mmol/L (ref 134–144)
Total Protein: 7 g/dL (ref 6.0–8.5)
eGFR: 63 mL/min/{1.73_m2} (ref >59)

## 2022-11-15 LAB — LIPID PANEL
Chol/HDL Ratio: 3.8 ratio (ref 0.0–4.4)
Cholesterol, Total: 153 mg/dL (ref 100–199)
HDL: 40 mg/dL (ref >39)
LDL Chol Calc (NIH): 81 mg/dL (ref 0–99)
Triglycerides: 191 mg/dL — ABNORMAL HIGH (ref 0–149)
VLDL Cholesterol Cal: 32 mg/dL (ref 5–40)

## 2022-11-15 LAB — CBC WITH DIFFERENTIAL/PLATELET
Basophils Absolute: 0 10*3/uL (ref 0.0–0.2)
Basos: 0 %
EOS (ABSOLUTE): 0.2 10*3/uL (ref 0.0–0.4)
Eos: 3 %
Hematocrit: 40.8 % (ref 34.0–46.6)
Hemoglobin: 13.7 g/dL (ref 11.1–15.9)
Immature Grans (Abs): 0 10*3/uL (ref 0.0–0.1)
Immature Granulocytes: 0 %
Lymphocytes Absolute: 1.5 10*3/uL (ref 0.7–3.1)
Lymphs: 23 %
MCH: 31.4 pg (ref 26.6–33.0)
MCHC: 33.6 g/dL (ref 31.5–35.7)
MCV: 94 fL (ref 79–97)
Monocytes Absolute: 0.5 10*3/uL (ref 0.1–0.9)
Monocytes: 8 %
Neutrophils Absolute: 4.3 10*3/uL (ref 1.4–7.0)
Neutrophils: 66 %
Platelets: 228 10*3/uL (ref 150–450)
RBC: 4.36 x10E6/uL (ref 3.77–5.28)
RDW: 13.1 % (ref 11.7–15.4)
WBC: 6.6 10*3/uL (ref 3.4–10.8)

## 2022-11-15 LAB — BAYER DCA HB A1C WAIVED: HB A1C (BAYER DCA - WAIVED): 5.2 % (ref 4.8–5.6)

## 2022-11-15 LAB — PHENYTOIN LEVEL, FREE AND TOTAL
Phenytoin, Free: 1.1 ug/mL (ref 1.0–2.0)
Phenytoin, Serum: 14.8 ug/mL (ref 10.0–20.0)

## 2022-11-15 LAB — HEPATITIS C ANTIBODY: Hep C Virus Ab: NONREACTIVE

## 2022-11-15 LAB — HIV ANTIBODY (ROUTINE TESTING W REFLEX): HIV Screen 4th Generation wRfx: NONREACTIVE

## 2022-11-15 LAB — T4, FREE: Free T4: 1.17 ng/dL (ref 0.82–1.77)

## 2022-11-15 LAB — TSH: TSH: 2.01 u[IU]/mL (ref 0.450–4.500)

## 2022-11-15 LAB — LEVETIRACETAM LEVEL: Levetiracetam Lvl: 15.2 ug/mL (ref 10.0–40.0)

## 2022-12-18 ENCOUNTER — Other Ambulatory Visit (HOSPITAL_COMMUNITY): Payer: Self-pay

## 2022-12-18 DIAGNOSIS — H524 Presbyopia: Secondary | ICD-10-CM | POA: Diagnosis not present

## 2022-12-18 DIAGNOSIS — H52223 Regular astigmatism, bilateral: Secondary | ICD-10-CM | POA: Diagnosis not present

## 2022-12-18 DIAGNOSIS — H5203 Hypermetropia, bilateral: Secondary | ICD-10-CM | POA: Diagnosis not present

## 2022-12-28 DIAGNOSIS — M170 Bilateral primary osteoarthritis of knee: Secondary | ICD-10-CM | POA: Diagnosis not present

## 2022-12-30 ENCOUNTER — Other Ambulatory Visit (HOSPITAL_COMMUNITY): Payer: Self-pay

## 2023-01-23 ENCOUNTER — Encounter (HOSPITAL_COMMUNITY): Payer: Self-pay

## 2023-01-23 ENCOUNTER — Other Ambulatory Visit: Payer: Self-pay

## 2023-01-23 ENCOUNTER — Other Ambulatory Visit (HOSPITAL_COMMUNITY): Payer: Self-pay

## 2023-02-08 ENCOUNTER — Other Ambulatory Visit (HOSPITAL_COMMUNITY): Payer: Self-pay

## 2023-02-11 ENCOUNTER — Other Ambulatory Visit (HOSPITAL_COMMUNITY): Payer: Self-pay

## 2023-02-12 ENCOUNTER — Other Ambulatory Visit: Payer: Self-pay

## 2023-02-12 ENCOUNTER — Other Ambulatory Visit (HOSPITAL_COMMUNITY): Payer: Self-pay

## 2023-02-13 ENCOUNTER — Other Ambulatory Visit: Payer: Self-pay

## 2023-02-13 ENCOUNTER — Other Ambulatory Visit (HOSPITAL_COMMUNITY): Payer: Self-pay

## 2023-02-14 ENCOUNTER — Other Ambulatory Visit (HOSPITAL_COMMUNITY): Payer: Self-pay

## 2023-02-26 ENCOUNTER — Encounter: Payer: Self-pay | Admitting: Neurology

## 2023-02-26 ENCOUNTER — Ambulatory Visit: Payer: Commercial Managed Care - PPO | Admitting: Neurology

## 2023-02-26 VITALS — BP 143/81 | HR 92 | Ht 70.0 in | Wt 285.0 lb

## 2023-02-26 DIAGNOSIS — G40309 Generalized idiopathic epilepsy and epileptic syndromes, not intractable, without status epilepticus: Secondary | ICD-10-CM | POA: Diagnosis not present

## 2023-02-26 NOTE — Progress Notes (Signed)
 PATIENT: Maria Rivers DOB: 1960/05/31  REASON FOR VISIT: follow up for seizures HISTORY FROM: patient PRIMARY NEUROLOGIST: Dr. Samara Crest   HISTORY OF PRESENT ILLNESS: Today 02/26/23 Patient presents today for follow-up, she is alone.  Last visit was a year ago, since then she has been doing well denies any seizure or seizure activity.  She tells me that she is doing well on Dilantin  and Keppra . Last level 3 months ago were within normal limits. Denies any side effect from the medication.  Currently taking vitamin D  supplement.   INTERVAL HISTORY 02/28/2022:  Here today for follow-up.  Doing well.  Labs 11/07/21 Keppra  level 10.2, Dilantin  free level 0.8.  CMP normal except glucose 141, CBC normal.  Vitamin D  44.3. thinks she may have missed her dosage timeframe for her Dilantin . Her 1st seizure was around 1992, on Dilantin  since then. 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 Originally her seizures were generalized, then become more partial type, which was most recent in 2018. Takes Vitamin D . Pays $ 100 for 3 month supply for brand name Dilantin . In past tried generic Dilantin , had back to back seizures. 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  Update  02/17/21 SS: [name] is here today for follow-up with history of well-controlled seizures on Dilantin  (brand name) and Keppra .  In December PCP check labs, Keppra  level was 19.5, Dilantin  level free was 0.9 (had missed a few doses at the time of lab draw got busy at work).  Creatinine was 1.25. Working as Charity fundraiser at [employer], full time, working overtime due to Marketing executive. Last seizure was in 2018, then Keppra  was started, no further events. Seizures have been staring off, can maintain function, tells me about a seizure she had a church taking notes, her notes were perfect. Reviewed CBC, CMP in Dec, creatinine was 1.25.   UPDATE 12/08/2019 SS: [name] is a 63 year old female history of seizures on Dilantin  and Keppra .   Seizures have been well controlled.  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  No seizures in several years, at that time, Keppra  was added, she had lost weight on Dilantin .  She has orthopedic issues, bone-on-bone both knees.  Is on vitamin D  supplementation.  PCP check blood work in August 2021, Free Dilantin  level was 1.2, Keppra  level 9.4.  Presents today for evaluation unaccompanied.  HISTORY  10/24/2018 Dr. Tilda Fogo: Ms. Maria Rivers is a 63 year old right-handed white female with a history of seizures that have been under relatively good control with the use of Dilantin  and Keppra .  The patient is on vitamin D  supplementation.  She has had recent blood levels done with a therapeutic Keppra  level of 27.0 and a free Dilantin  level of 1.1 that is therapeutic.  The patient tolerates the medications well.  She is able to operate a motor vehicle without difficulty.  She is followed through orthopedic surgery for her knee arthritis and requires injections on occasion.  She returns to the office today for an evaluation.  REVIEW OF SYSTEMS: Out of a complete 14 system review of symptoms, the patient complains only of the following symptoms, and all other reviewed systems are negative.  See HPI  ALLERGIES: Allergies  Allergen Reactions   Mushroom Extract Complex (Obsolete) Swelling   Penicillins     UNKNOWN REACTION   Shellfish Allergy Swelling   Tamiflu [Oseltamivir Phosphate] Rash    Severe rash, hives    HOME  MEDICATIONS: Outpatient Medications Prior to Visit  Medication Sig Dispense Refill   acetaminophen (TYLENOL) 325 MG suppository Place 325 mg rectally every 4 (four) hours as needed.     cholecalciferol (VITAMIN D ) 1000 UNITS tablet Take 2,000 Units by mouth daily.      DILANTIN  100 MG ER capsule Take 3 capsules (300 mg total) by mouth 2 (two) times daily. 540 capsule 3   hydrochlorothiazide  (HYDRODIURIL ) 25 MG tablet Take 1 tablet (25 mg total) by mouth daily. 90 tablet 3    levETIRAcetam  (KEPPRA ) 500 MG tablet Take 1/2 tablet by mouth in the morning and take 2 tablets in the evening. 225 tablet 4   levothyroxine  (SYNTHROID ) 25 MCG tablet TAKE 1 TABLET BY MOUTH ONCE DAILY 90 tablet 3   lisinopril  (ZESTRIL ) 10 MG tablet Take 1 tablet (10 mg total) by mouth daily. 90 tablet 3   loratadine (CLARITIN) 10 MG tablet Take 10 mg by mouth daily.     Multiple Vitamin (MULTIVITAMIN WITH MINERALS) TABS tablet Take 1 tablet by mouth daily.     rosuvastatin  (CRESTOR ) 20 MG tablet Take 1 tablet (20 mg total) by mouth daily. 90 tablet 3   ibuprofen (ADVIL,MOTRIN) 200 MG tablet Take 200 mg by mouth 2 (two) times daily. For pain (Patient not taking: Reported on 02/26/2023)     No facility-administered medications prior to visit.    PAST MEDICAL HISTORY: Past Medical History:  Diagnosis Date   Hyperlipidemia    Hypertension    Seizures (HCC)     PAST SURGICAL HISTORY: History reviewed. No pertinent surgical history.  FAMILY HISTORY: Family History  Problem Relation Age of Onset   Diabetes Other     SOCIAL HISTORY: Social History   Socioeconomic History   Marital status: Married    Spouse name: Not on file   Number of children: Not on file   Years of education: Not on file   Highest education level: Not on file  Occupational History    Comment: RN [employer]  Tobacco Use   Smoking status: Never   Smokeless tobacco: Never  Vaping Use   Vaping status: Never Used  Substance and Sexual Activity   Alcohol use: No   Drug use: No   Sexual activity: Yes    Birth control/protection: None  Other Topics Concern   Not on file  Social History Narrative   Lives at home with husband and 2 cats   Right handed   Caffeine: avg 2 cups/day diet mtn dew (no coffee)   Social Drivers of Corporate investment banker Strain: Not on file  Food Insecurity: Not on file  Transportation Needs: Not on file  Physical Activity: Not on file  Stress: Not on file  Social  Connections: Not on file  Intimate Partner Violence: Not on file   PHYSICAL EXAM  Vitals:   02/26/23 1532  BP: (!) 143/81  Pulse: 92  Weight: 285 lb (129.3 kg)  Height: 5\' 10"  (1.778 m)   Body mass index is 40.89 kg/m.  Generalized: Well developed, in no acute distress   Neurological examination  Mentation: Alert oriented to time, place, history taking. Follows all commands speech and language fluent Cranial nerve II-XII: Pupils were equal round reactive to light. Extraocular movements were full, visual field were full on confrontational test. Facial sensation and strength were normal. Head turning and shoulder shrug  were normal and symmetric. Motor: The motor testing reveals 5 over 5 strength of all 4 extremities. Good symmetric  motor tone is noted throughout.  Sensory: Sensory testing is intact to soft touch on all 4 extremities. No evidence of extinction is noted.  Coordination: Cerebellar testing reveals good finger-nose-finger and heel-to-shin bilaterally.  Gait and station: Gait is slightly wide-based, steady, antalgic   DIAGNOSTIC DATA (LABS, IMAGING, TESTING) - I reviewed patient records, labs, notes, testing and imaging myself where available.  Lab Results  Component Value Date   WBC 6.6 11/14/2022   HGB 13.7 11/14/2022   HCT 40.8 11/14/2022   MCV 94 11/14/2022   PLT 228 11/14/2022      Component Value Date/Time   NA 139 11/14/2022 1559   K 4.5 11/14/2022 1559   CL 102 11/14/2022 1559   CO2 23 11/14/2022 1559   GLUCOSE 97 11/14/2022 1559   BUN 26 11/14/2022 1559   CREATININE 1.01 (H) 11/14/2022 1559   CALCIUM  9.5 11/14/2022 1559   PROT 7.0 11/14/2022 1559   ALBUMIN 4.2 11/14/2022 1559   AST 17 11/14/2022 1559   ALT 17 11/14/2022 1559   ALKPHOS 87 11/14/2022 1559   BILITOT 0.2 11/14/2022 1559   GFRNONAA 49 (L) 09/02/2019 1610   GFRAA 56 (L) 09/02/2019 1610   Lab Results  Component Value Date   CHOL 153 11/14/2022   HDL 40 11/14/2022   LDLCALC 81  11/14/2022   TRIG 191 (H) 11/14/2022   CHOLHDL 3.8 11/14/2022   Lab Results  Component Value Date   HGBA1C 5.2 11/14/2022   No results found for: "VITAMINB12" Lab Results  Component Value Date   TSH 2.010 11/14/2022    ASSESSMENT AND PLAN 63 y.o. year old female  has a past medical history of Hyperlipidemia, Hypertension, and Seizures (HCC). here with:  1.  History of seizures, well controlled  -Continues to do well, last seizure was in 2018 -We will continue current dose of brand-name Dilantin , generic Keppra  -Discussed plan to taper and discontinue Dilantin , and possible need of Valtoco as a rescue medication in the case of a breakthrough seizure, but she does not want to changes or switch her medications. She understands all the risks and side effects and would like to stay on Keppra  and Dilantin   -She is on oral vitamin D  supplement  -Follow up in a year with Isa Manuel   No orders of the defined types were placed in this encounter.  Cassandra Cleveland, MD 02/26/2023, 5:38 PM [facility] [address] [place], [place] [address] [phone]

## 2023-02-27 ENCOUNTER — Other Ambulatory Visit: Payer: Self-pay

## 2023-03-03 ENCOUNTER — Other Ambulatory Visit: Payer: Self-pay | Admitting: Neurology

## 2023-03-03 DIAGNOSIS — G40309 Generalized idiopathic epilepsy and epileptic syndromes, not intractable, without status epilepticus: Secondary | ICD-10-CM

## 2023-03-05 ENCOUNTER — Other Ambulatory Visit (HOSPITAL_COMMUNITY): Payer: Self-pay

## 2023-03-05 MED ORDER — DILANTIN 100 MG PO CAPS
300.0000 mg | ORAL_CAPSULE | Freq: Two times a day (BID) | ORAL | 3 refills | Status: AC
  Filled 2023-03-05 – 2023-03-14 (×2): qty 540, 90d supply, fill #0
  Filled 2023-07-20 – 2023-07-23 (×2): qty 540, 90d supply, fill #1
  Filled 2023-11-26: qty 540, 90d supply, fill #2

## 2023-03-06 ENCOUNTER — Other Ambulatory Visit (HOSPITAL_COMMUNITY): Payer: Self-pay

## 2023-03-09 ENCOUNTER — Other Ambulatory Visit (HOSPITAL_COMMUNITY): Payer: Self-pay

## 2023-03-09 ENCOUNTER — Other Ambulatory Visit: Payer: Self-pay

## 2023-03-09 ENCOUNTER — Encounter: Payer: Self-pay | Admitting: Pharmacist

## 2023-03-14 ENCOUNTER — Other Ambulatory Visit (HOSPITAL_COMMUNITY): Payer: Self-pay

## 2023-04-21 ENCOUNTER — Other Ambulatory Visit: Payer: Self-pay | Admitting: Neurology

## 2023-04-22 ENCOUNTER — Other Ambulatory Visit (HOSPITAL_COMMUNITY): Payer: Self-pay

## 2023-04-23 ENCOUNTER — Other Ambulatory Visit (HOSPITAL_COMMUNITY): Payer: Self-pay

## 2023-04-24 ENCOUNTER — Other Ambulatory Visit (HOSPITAL_COMMUNITY): Payer: Self-pay

## 2023-04-24 MED ORDER — LEVETIRACETAM 500 MG PO TABS
ORAL_TABLET | ORAL | 4 refills | Status: AC
  Filled 2023-04-24: qty 225, 90d supply, fill #0
  Filled 2023-07-20 – 2023-07-23 (×2): qty 225, 90d supply, fill #1
  Filled 2023-10-26: qty 225, 90d supply, fill #2
  Filled 2024-01-25: qty 225, 90d supply, fill #3

## 2023-05-21 ENCOUNTER — Other Ambulatory Visit (HOSPITAL_COMMUNITY): Payer: Self-pay

## 2023-07-21 ENCOUNTER — Other Ambulatory Visit (HOSPITAL_COMMUNITY): Payer: Self-pay

## 2023-07-23 ENCOUNTER — Other Ambulatory Visit: Payer: Self-pay

## 2023-07-23 ENCOUNTER — Other Ambulatory Visit (HOSPITAL_COMMUNITY): Payer: Self-pay

## 2023-07-24 ENCOUNTER — Other Ambulatory Visit: Payer: Self-pay

## 2023-08-26 ENCOUNTER — Other Ambulatory Visit (HOSPITAL_BASED_OUTPATIENT_CLINIC_OR_DEPARTMENT_OTHER): Payer: Self-pay

## 2023-08-27 ENCOUNTER — Other Ambulatory Visit: Payer: Self-pay

## 2023-08-27 ENCOUNTER — Other Ambulatory Visit (HOSPITAL_COMMUNITY): Payer: Self-pay

## 2023-10-16 ENCOUNTER — Other Ambulatory Visit: Payer: Self-pay | Admitting: Family Medicine

## 2023-10-16 DIAGNOSIS — I10 Essential (primary) hypertension: Secondary | ICD-10-CM

## 2023-10-17 ENCOUNTER — Other Ambulatory Visit (HOSPITAL_COMMUNITY): Payer: Self-pay

## 2023-10-17 ENCOUNTER — Other Ambulatory Visit: Payer: Self-pay

## 2023-10-17 MED ORDER — HYDROCHLOROTHIAZIDE 25 MG PO TABS
25.0000 mg | ORAL_TABLET | Freq: Every day | ORAL | 0 refills | Status: DC
  Filled 2023-10-17: qty 90, 90d supply, fill #0

## 2023-10-27 ENCOUNTER — Other Ambulatory Visit (HOSPITAL_COMMUNITY): Payer: Self-pay

## 2023-10-29 ENCOUNTER — Other Ambulatory Visit (HOSPITAL_COMMUNITY): Payer: Self-pay

## 2023-11-13 ENCOUNTER — Telehealth: Payer: Self-pay

## 2023-11-13 NOTE — Telephone Encounter (Signed)
 Left message for patient to call back, wanted to see if patient could come in at 3:00 on 11/14/2023 instead of 3:45

## 2023-11-14 ENCOUNTER — Encounter: Payer: Self-pay | Admitting: Family Medicine

## 2023-11-14 ENCOUNTER — Ambulatory Visit (INDEPENDENT_AMBULATORY_CARE_PROVIDER_SITE_OTHER): Payer: Commercial Managed Care - PPO | Admitting: Family Medicine

## 2023-11-14 ENCOUNTER — Other Ambulatory Visit: Payer: Self-pay

## 2023-11-14 VITALS — BP 104/68 | HR 104 | Temp 97.7°F | Ht 70.0 in | Wt 286.2 lb

## 2023-11-14 DIAGNOSIS — E039 Hypothyroidism, unspecified: Secondary | ICD-10-CM | POA: Diagnosis not present

## 2023-11-14 DIAGNOSIS — Z0001 Encounter for general adult medical examination with abnormal findings: Secondary | ICD-10-CM

## 2023-11-14 DIAGNOSIS — E8881 Metabolic syndrome: Secondary | ICD-10-CM

## 2023-11-14 DIAGNOSIS — E559 Vitamin D deficiency, unspecified: Secondary | ICD-10-CM

## 2023-11-14 DIAGNOSIS — Z0000 Encounter for general adult medical examination without abnormal findings: Secondary | ICD-10-CM

## 2023-11-14 DIAGNOSIS — E782 Mixed hyperlipidemia: Secondary | ICD-10-CM

## 2023-11-14 DIAGNOSIS — G40909 Epilepsy, unspecified, not intractable, without status epilepticus: Secondary | ICD-10-CM | POA: Diagnosis not present

## 2023-11-14 DIAGNOSIS — I10 Essential (primary) hypertension: Secondary | ICD-10-CM | POA: Diagnosis not present

## 2023-11-14 LAB — BAYER DCA HB A1C WAIVED: HB A1C (BAYER DCA - WAIVED): 4.6 % — ABNORMAL LOW (ref 4.8–5.6)

## 2023-11-14 MED ORDER — HYDROCHLOROTHIAZIDE 25 MG PO TABS
25.0000 mg | ORAL_TABLET | Freq: Every day | ORAL | 3 refills | Status: AC
  Filled 2023-11-14 – 2024-01-25 (×2): qty 90, 90d supply, fill #0

## 2023-11-14 MED ORDER — LEVOTHYROXINE SODIUM 25 MCG PO TABS
25.0000 ug | ORAL_TABLET | Freq: Every day | ORAL | 3 refills | Status: AC
  Filled 2023-11-14 – 2024-01-25 (×2): qty 90, 90d supply, fill #0

## 2023-11-14 MED ORDER — LISINOPRIL 10 MG PO TABS
10.0000 mg | ORAL_TABLET | Freq: Every day | ORAL | 3 refills | Status: AC
  Filled 2023-11-14: qty 90, 90d supply, fill #0
  Filled 2024-02-08: qty 90, 90d supply, fill #1

## 2023-11-14 MED ORDER — ROSUVASTATIN CALCIUM 20 MG PO TABS
20.0000 mg | ORAL_TABLET | Freq: Every day | ORAL | 3 refills | Status: AC
  Filled 2023-11-14 – 2024-01-25 (×2): qty 90, 90d supply, fill #0

## 2023-11-14 NOTE — Progress Notes (Signed)
 Maria Rivers is a 63 y.o. female presents to office today for annual physical exam examination.     She reports she is doing well. She self reduced Dilantin  to 200mg  BID because she was having excessive fatigue and malaise.  She continues Keppra .  Denies any seizure activities.  She is feeling better on the reduced dose.   Occupation: Investment Banker, Operational, Substance use: none Health Maintenance Due  Topic Date Due   Zoster Vaccines- Shingrix (1 of 2) Never done   Pneumococcal Vaccine: 50+ Years (1 of 1 - PCV) Never done   Influenza Vaccine  08/17/2023   COVID-19 Vaccine (7 - 2025-26 season) 09/17/2023    Immunization History  Administered Date(s) Administered   Influenza, Seasonal, Injecte, Preservative Fre 11/14/2022   Influenza,inj,Quad PF,6+ Mos 10/18/2018, 11/14/2019, 10/06/2020, 11/07/2021   Influenza,inj,quad, With Preservative 11/02/2017   Influenza-Unspecified 10/20/2016, 11/02/2017   Moderna Covid-19 Fall Seasonal Vaccine 44yrs & older 11/09/2021, 10/24/2022   Moderna Sars-Covid-2 Vaccination 01/13/2019, 02/11/2019, 09/10/2019, 11/09/2020   Tdap 02/16/2021   Past Medical History:  Diagnosis Date   Hyperlipidemia    Hypertension    Seizures (HCC)    Social History   Socioeconomic History   Marital status: Married    Spouse name: Not on file   Number of children: Not on file   Years of education: Not on file   Highest education level: Bachelor's degree (e.g., BA, AB, BS)  Occupational History    Comment: RN [employer]  Tobacco Use   Smoking status: Never   Smokeless tobacco: Never  Vaping Use   Vaping status: Never Used  Substance and Sexual Activity   Alcohol use: No   Drug use: No   Sexual activity: Yes    Birth control/protection: None  Other Topics Concern   Not on file  Social History Narrative   Lives at home with husband and 2 cats   Right handed   Caffeine: avg 2 cups/day diet mtn dew (no coffee)   Social Drivers of Corporate Investment Banker  Strain: Low Risk  (11/12/2023)   Overall Financial Resource Strain (CARDIA)    Difficulty of Paying Living Expenses: Not hard at all  Food Insecurity: No Food Insecurity (11/12/2023)   Hunger Vital Sign    Worried About Running Out of Food in the Last Year: Never true    Ran Out of Food in the Last Year: Never true  Transportation Needs: No Transportation Needs (11/12/2023)   PRAPARE - Administrator, Civil Service (Medical): No    Lack of Transportation (Non-Medical): No  Physical Activity: Sufficiently Active (11/12/2023)   Exercise Vital Sign    Days of Exercise per Week: 6 days    Minutes of Exercise per Session: 60 min  Stress: Stress Concern Present (11/12/2023)   [employer] of Occupational Health - Occupational Stress Questionnaire    Feeling of Stress: To some extent  Social Connections: Socially Integrated (11/12/2023)   Social Connection and Isolation Panel    Frequency of Communication with Friends and Family: More than three times a week    Frequency of Social Gatherings with Friends and Family: Three times a week    Attends Religious Services: More than 4 times per year    Active Member of Clubs or Organizations: Yes    Attends Banker Meetings: More than 4 times per year    Marital Status: Married  Catering Manager Violence: Not on file   History reviewed. No  pertinent surgical history. Family History  Problem Relation Age of Onset   Diabetes Other     Current Outpatient Medications:    acetaminophen (TYLENOL) 325 MG suppository, Place 325 mg rectally every 4 (four) hours as needed., Disp: , Rfl:    cholecalciferol (VITAMIN D ) 1000 UNITS tablet, Take 2,000 Units by mouth daily. , Disp: , Rfl:    DILANTIN  100 MG ER capsule, Take 3 capsules (300 mg total) by mouth 2 (two) times daily., Disp: 540 capsule, Rfl: 3   hydrochlorothiazide  (HYDRODIURIL ) 25 MG tablet, Take 1 tablet (25 mg total) by mouth daily., Disp: 90 tablet, Rfl: 0    levETIRAcetam  (KEPPRA ) 500 MG tablet, Take 1/2 tablet by mouth in the morning and take 2 tablets in the evening., Disp: 225 tablet, Rfl: 4   levothyroxine  (SYNTHROID ) 25 MCG tablet, TAKE 1 TABLET BY MOUTH ONCE DAILY, Disp: 90 tablet, Rfl: 3   lisinopril  (ZESTRIL ) 10 MG tablet, Take 1 tablet (10 mg total) by mouth daily., Disp: 90 tablet, Rfl: 3   loratadine (CLARITIN) 10 MG tablet, Take 10 mg by mouth daily., Disp: , Rfl:    Multiple Vitamin (MULTIVITAMIN WITH MINERALS) TABS tablet, Take 1 tablet by mouth daily., Disp: , Rfl:    rosuvastatin  (CRESTOR ) 20 MG tablet, Take 1 tablet (20 mg total) by mouth daily., Disp: 90 tablet, Rfl: 3   ibuprofen (ADVIL,MOTRIN) 200 MG tablet, Take 200 mg by mouth 2 (two) times daily. For pain (Patient not taking: Reported on 11/14/2023), Disp: , Rfl:   Allergies  Allergen Reactions   Mushroom Extract Complex (Obsolete) Swelling   Penicillins     UNKNOWN REACTION   Shellfish Allergy Swelling   Tamiflu [Oseltamivir Phosphate] Rash    Severe rash, hives     ROS: Review of Systems A comprehensive review of systems was negative except for: Musculoskeletal: positive for chronic knee pain    Physical exam BP 104/68   Pulse (!) 104   Temp 97.7 F (36.5 C)   Ht 5' 10 (1.778 m)   Wt 286 lb 4 oz (129.8 kg)   LMP 03/16/2011   SpO2 95%   BMI 41.07 kg/m  General appearance: alert, cooperative, appears stated age, no distress, and morbidly obese Head: Normocephalic, without obvious abnormality, atraumatic Eyes: Wears glasses.  Sclera white.  PERRLA, EOMI Ears: normal TM's and external ear canals both ears Nose: Nares normal. Septum midline. Mucosa normal. No drainage or sinus tenderness. Throat: lips, mucosa, and tongue normal; teeth and gums normal Neck: no adenopathy, no JVD, supple, symmetrical, trachea midline, and thyroid  not enlarged, symmetric, no tenderness/mass/nodules Back: symmetric, no curvature. ROM normal. No CVA tenderness. Lungs: clear to  auscultation bilaterally Heart: regular rate and rhythm, S1, S2 normal, no murmur, click, rub or gallop Abdomen: Obese, soft, nontender, nondistended.  No palpable hepatosplenomegaly Extremities: Trace nonpitting edema.  She has valgus deformity of the knees with osteoarthritic changes noted Pulses: 2+ and symmetric Skin: Skin color, texture, turgor normal. No rashes or lesions Lymph nodes: Anterior cervical lymph nodes and supraclavicular lymph nodes without enlargement Neurologic: Grossly normal      11/14/2022    4:07 PM 11/07/2021    3:42 PM 11/08/2020    3:27 PM  Depression screen PHQ 2/9  Decreased Interest 0 0 0  Down, Depressed, Hopeless 0 0 0  PHQ - 2 Score 0 0 0  Altered sleeping 0  0  Tired, decreased energy 0  0  Change in appetite 0  0  Feeling bad  or failure about yourself  0  0  Trouble concentrating 0  0  Moving slowly or fidgety/restless 0  0  Suicidal thoughts 0  0  PHQ-9 Score 0  0  Difficult doing work/chores Not difficult at all  Somewhat difficult      11/14/2022    4:07 PM 11/07/2021    3:42 PM 11/08/2020    3:27 PM  GAD 7 : Generalized Anxiety Score  Nervous, Anxious, on Edge 0 0 0  Control/stop worrying 0 0 0  Worry too much - different things 0 0 0  Trouble relaxing 0 0 0  Restless 0 0 0  Easily annoyed or irritable 0 0 0  Afraid - awful might happen 0 0 0  Total GAD 7 Score 0 0 0  Anxiety Difficulty Not difficult at all Not difficult at all Not difficult at all    No results found for this or any previous visit (from the past 2160 hours).   Assessment/ Plan: Nataley B Spofford here for annual physical exam.   Annual physical exam  Primary hypertension - Plan: CMP14+EGFR, hydrochlorothiazide  (HYDRODIURIL ) 25 MG tablet, lisinopril  (ZESTRIL ) 10 MG tablet  Mixed hyperlipidemia - Plan: CMP14+EGFR, Lipid Panel, rosuvastatin  (CRESTOR ) 20 MG tablet  Acquired hypothyroidism - Plan: CMP14+EGFR, TSH + free T4, levothyroxine  (SYNTHROID ) 25 MCG  tablet  Seizure disorder (HCC) - Plan: CMP14+EGFR, Levetiracetam  level, Phenytoin  level, free and total, CBC with Differential  Metabolic syndrome - Plan: CMP14+EGFR, Bayer DCA Hb A1c Waived  Vitamin D  deficiency - Plan: CMP14+EGFR, VITAMIN D  25 Hydroxy (Vit-D Deficiency, Fractures)  Morbid obesity due to excess calories (HCC) - Plan: CMP14+EGFR, TSH + free T4, Bayer DCA Hb A1c Waived   Blood pressure is controlled.  No changes.  All meds renewed.  Chronic issues are stable.  Fasting labs collected.  She had influenza vaccination done today at an outside facility and she declined the other vaccines  Counseled on healthy lifestyle choices, including diet (rich in fruits, vegetables and lean meats and low in salt and simple carbohydrates) and exercise (at least 30 minutes of moderate physical activity daily).  Patient to follow up 1 year  Erik Nessel M. Jolinda, DO

## 2023-11-15 ENCOUNTER — Other Ambulatory Visit (HOSPITAL_COMMUNITY): Payer: Self-pay

## 2023-11-15 ENCOUNTER — Other Ambulatory Visit: Payer: Self-pay

## 2023-11-16 ENCOUNTER — Other Ambulatory Visit: Payer: Self-pay | Admitting: Family Medicine

## 2023-11-16 ENCOUNTER — Other Ambulatory Visit: Payer: Self-pay

## 2023-11-16 ENCOUNTER — Other Ambulatory Visit (HOSPITAL_COMMUNITY): Payer: Self-pay

## 2023-11-16 ENCOUNTER — Ambulatory Visit: Payer: Self-pay | Admitting: Family Medicine

## 2023-11-16 LAB — VITAMIN D 25 HYDROXY (VIT D DEFICIENCY, FRACTURES): Vit D, 25-Hydroxy: 43.7 ng/mL (ref 30.0–100.0)

## 2023-11-16 LAB — LIPID PANEL
Chol/HDL Ratio: 3.3 ratio (ref 0.0–4.4)
Cholesterol, Total: 136 mg/dL (ref 100–199)
HDL: 41 mg/dL (ref >39)
LDL Chol Calc (NIH): 69 mg/dL (ref 0–99)
Triglycerides: 147 mg/dL (ref 0–149)
VLDL Cholesterol Cal: 26 mg/dL (ref 5–40)

## 2023-11-16 LAB — CBC WITH DIFFERENTIAL/PLATELET
Basophils Absolute: 0 x10E3/uL (ref 0.0–0.2)
Basos: 0 %
EOS (ABSOLUTE): 0.2 x10E3/uL (ref 0.0–0.4)
Eos: 4 %
Hematocrit: 42.1 % (ref 34.0–46.6)
Hemoglobin: 13.7 g/dL (ref 11.1–15.9)
Immature Grans (Abs): 0 x10E3/uL (ref 0.0–0.1)
Immature Granulocytes: 0 %
Lymphocytes Absolute: 1.7 x10E3/uL (ref 0.7–3.1)
Lymphs: 26 %
MCH: 30.2 pg (ref 26.6–33.0)
MCHC: 32.5 g/dL (ref 31.5–35.7)
MCV: 93 fL (ref 79–97)
Monocytes Absolute: 0.5 x10E3/uL (ref 0.1–0.9)
Monocytes: 8 %
Neutrophils Absolute: 3.9 x10E3/uL (ref 1.4–7.0)
Neutrophils: 61 %
Platelets: 223 x10E3/uL (ref 150–450)
RBC: 4.53 x10E6/uL (ref 3.77–5.28)
RDW: 13.7 % (ref 11.7–15.4)
WBC: 6.4 x10E3/uL (ref 3.4–10.8)

## 2023-11-16 LAB — CMP14+EGFR
ALT: 21 IU/L (ref 0–32)
AST: 19 IU/L (ref 0–40)
Albumin: 4.2 g/dL (ref 3.9–4.9)
Alkaline Phosphatase: 80 IU/L (ref 49–135)
BUN/Creatinine Ratio: 31 — ABNORMAL HIGH (ref 12–28)
BUN: 30 mg/dL — ABNORMAL HIGH (ref 8–27)
Bilirubin Total: 0.3 mg/dL (ref 0.0–1.2)
CO2: 23 mmol/L (ref 20–29)
Calcium: 9.7 mg/dL (ref 8.7–10.3)
Chloride: 96 mmol/L (ref 96–106)
Creatinine, Ser: 0.96 mg/dL (ref 0.57–1.00)
Globulin, Total: 3.1 g/dL (ref 1.5–4.5)
Glucose: 97 mg/dL (ref 70–99)
Potassium: 4.2 mmol/L (ref 3.5–5.2)
Sodium: 134 mmol/L (ref 134–144)
Total Protein: 7.3 g/dL (ref 6.0–8.5)
eGFR: 66 mL/min/1.73

## 2023-11-16 LAB — PHENYTOIN LEVEL, FREE AND TOTAL
Phenytoin, Free: NOT DETECTED ug/mL (ref 1.0–2.0)
Phenytoin, Serum: 5 ug/mL — ABNORMAL LOW (ref 10.0–20.0)

## 2023-11-16 LAB — TSH+FREE T4
Free T4: 1.22 ng/dL (ref 0.82–1.77)
TSH: 1.96 u[IU]/mL (ref 0.450–4.500)

## 2023-11-16 LAB — LEVETIRACETAM LEVEL: Levetiracetam Lvl: 19.5 ug/mL (ref 10.0–40.0)

## 2023-11-16 MED ORDER — DOXYCYCLINE HYCLATE 100 MG PO TABS
100.0000 mg | ORAL_TABLET | Freq: Two times a day (BID) | ORAL | 0 refills | Status: AC
  Filled 2023-11-16: qty 20, 10d supply, fill #0

## 2023-11-16 NOTE — Progress Notes (Signed)
 Her lower Dilantin  level is related to the lower dose that she is now taking. Keppra  is within normal limits. Since no seizures, I will not advise her to increase back the Dilantin  because the plan was to reduce and eventually stop Dilantin .

## 2023-11-26 ENCOUNTER — Other Ambulatory Visit (HOSPITAL_COMMUNITY): Payer: Self-pay

## 2023-11-27 ENCOUNTER — Other Ambulatory Visit (HOSPITAL_COMMUNITY): Payer: Self-pay

## 2023-12-26 ENCOUNTER — Encounter: Payer: Self-pay | Admitting: Neurology

## 2023-12-26 NOTE — Telephone Encounter (Signed)
 Pt last seen 02/2023.  Has tried generic dilantin  in past 2018 note had back to back sz/SE. Your last note below 02/2023: plan  ASSESSMENT AND PLAN 63 y.o. year old female  has a past medical history of Hyperlipidemia, Hypertension, and Seizures (HCC). here with:   1.  History of seizures, well controlled   -Continues to do well, last seizure was in 2018 -We will continue current dose of brand-name Dilantin , generic Keppra  -Discussed plan to taper and discontinue Dilantin , and possible need of Valtoco as a rescue medication in the case of a breakthrough seizure, but she does not want to changes or switch her medications. She understands all the risks and side effects and would like to stay on Keppra  and Dilantin   -She is on oral vitamin D  supplement  -Follow up in a year with Lauraine.

## 2023-12-27 NOTE — Telephone Encounter (Signed)
 I think we should switch her to generic Dilantin  first. Thanks

## 2024-01-26 ENCOUNTER — Other Ambulatory Visit (HOSPITAL_COMMUNITY): Payer: Self-pay

## 2024-01-27 ENCOUNTER — Other Ambulatory Visit: Payer: Self-pay

## 2024-02-08 ENCOUNTER — Other Ambulatory Visit: Payer: Self-pay

## 2024-02-10 ENCOUNTER — Other Ambulatory Visit (HOSPITAL_COMMUNITY): Payer: Self-pay

## 2024-03-04 ENCOUNTER — Ambulatory Visit: Payer: Commercial Managed Care - PPO | Admitting: Neurology

## 2024-11-19 ENCOUNTER — Encounter: Admitting: Family Medicine
